# Patient Record
Sex: Female | Born: 1965 | Hispanic: Yes | Marital: Married | Smoking: Never smoker
Health system: Southern US, Community
[De-identification: ages and names within clinical notes are randomized; demographics above are authoritative.]

## PROBLEM LIST (undated history)

## (undated) DIAGNOSIS — M1990 Unspecified osteoarthritis, unspecified site: Secondary | ICD-10-CM

## (undated) DIAGNOSIS — G43909 Migraine, unspecified, not intractable, without status migrainosus: Secondary | ICD-10-CM

## (undated) DIAGNOSIS — E063 Autoimmune thyroiditis: Secondary | ICD-10-CM

## (undated) DIAGNOSIS — D649 Anemia, unspecified: Secondary | ICD-10-CM

## (undated) HISTORY — PX: LIPOMA RESECTION: SHX23

## (undated) HISTORY — DX: Unspecified osteoarthritis, unspecified site: M19.90

## (undated) HISTORY — DX: Migraine, unspecified, not intractable, without status migrainosus: G43.909

## (undated) HISTORY — DX: Anemia, unspecified: D64.9

## (undated) HISTORY — DX: Autoimmune thyroiditis: E06.3

---

## 2002-07-12 HISTORY — PX: VARICOSE VEIN SURGERY: SHX832

## 2003-09-05 ENCOUNTER — Other Ambulatory Visit: Admission: RE | Admit: 2003-09-05 | Discharge: 2003-09-05 | Payer: Self-pay | Admitting: Family Medicine

## 2008-07-12 LAB — CONVERTED CEMR LAB

## 2009-09-08 ENCOUNTER — Ambulatory Visit: Payer: Self-pay | Admitting: Internal Medicine

## 2009-09-08 DIAGNOSIS — D638 Anemia in other chronic diseases classified elsewhere: Secondary | ICD-10-CM | POA: Insufficient documentation

## 2009-09-08 DIAGNOSIS — L819 Disorder of pigmentation, unspecified: Secondary | ICD-10-CM | POA: Insufficient documentation

## 2009-09-08 DIAGNOSIS — D649 Anemia, unspecified: Secondary | ICD-10-CM | POA: Insufficient documentation

## 2009-09-08 DIAGNOSIS — J309 Allergic rhinitis, unspecified: Secondary | ICD-10-CM | POA: Insufficient documentation

## 2009-09-08 DIAGNOSIS — R51 Headache: Secondary | ICD-10-CM | POA: Insufficient documentation

## 2009-09-08 DIAGNOSIS — M1990 Unspecified osteoarthritis, unspecified site: Secondary | ICD-10-CM | POA: Insufficient documentation

## 2009-09-08 DIAGNOSIS — G43009 Migraine without aura, not intractable, without status migrainosus: Secondary | ICD-10-CM | POA: Insufficient documentation

## 2009-09-08 DIAGNOSIS — R519 Headache, unspecified: Secondary | ICD-10-CM | POA: Insufficient documentation

## 2009-09-08 LAB — CONVERTED CEMR LAB
Basophils Absolute: 0.1 10*3/uL (ref 0.0–0.1)
Cholesterol: 154 mg/dL (ref 0–200)
Eosinophils Absolute: 0.2 10*3/uL (ref 0.0–0.7)
HDL: 44.1 mg/dL (ref >39.00)
Iron: 39 ug/dL — ABNORMAL LOW (ref 42–145)
Lymphocytes Relative: 29.3 % (ref 12.0–46.0)
MCHC: 33.1 g/dL (ref 30.0–36.0)
Neutrophils Relative %: 61.5 % (ref 43.0–77.0)
Platelets: 247 10*3/uL (ref 150.0–400.0)
RBC: 3.72 M/uL — ABNORMAL LOW (ref 3.87–5.11)
RDW: 12.9 % (ref 11.5–14.6)
Transferrin: 234 mg/dL (ref 212.0–360.0)
Triglycerides: 48 mg/dL (ref 0.0–149.0)
Vitamin B-12: 453 pg/mL (ref 211–911)

## 2009-09-10 ENCOUNTER — Encounter: Payer: Self-pay | Admitting: Internal Medicine

## 2009-09-16 ENCOUNTER — Telehealth: Payer: Self-pay | Admitting: Internal Medicine

## 2009-09-29 ENCOUNTER — Telehealth: Payer: Self-pay | Admitting: Internal Medicine

## 2009-09-29 DIAGNOSIS — R21 Rash and other nonspecific skin eruption: Secondary | ICD-10-CM | POA: Insufficient documentation

## 2010-08-11 NOTE — Assessment & Plan Note (Signed)
Summary: NEW/ [employer] /[place]  INTERPRETER IS SCHEDULED/NWS   Vital Signs:  Patient profile:   46 year old female Menstrual status:  regular LMP:     09/05/2009 Height:      66 inches Weight:      156.25 pounds BMI:     25.31 O2 Sat:      98 % on Room air Temp:     98.4 degrees F oral Pulse rate:   74 / minute Pulse rhythm:   regular Resp:     16 per minute BP sitting:   100 / 64  (left arm)  Vitals Entered By: Lucious Groves (September 08, 2009 1:46 PM)  Nutrition Counseling: Patient's BMI is greater than 25 and therefore counseled on weight management options.  O2 Flow:  Room air CC: NP--Interpretor present. CPX./kb, Headaches, Headache, Preventive Care Is Patient Diabetic? No Pain Assessment Patient in pain? no      LMP (date): 09/05/2009     Menstrual Status regular Enter LMP: 09/05/2009 Last PAP Result Historical   Primary Care Provider:  Etta Grandchild MD  CC:  NP--Interpretor present. CPX./kb, Headaches, Headache, and Preventive Care.  History of Present Illness:  Headaches      This is a 45 year old woman who presents with Headaches.  The symptoms began >1 year ago.  On a scale of 1 to 10, the intensity is described as a 1.  The patient denies nausea, vomiting, sweats, tearing of eyes, nasal congestion, sinus pain, sinus pressure, photophobia, and phonophobia.  The headache is described as intermittent.  The location of the pain is bitemporal.  The patient denies the following high-risk features: fever, neck pain/stiffness, vision loss or change, focal weakness, altered mental status, rash, trauma, pain worse with exertion, new type of headache, age >50 years, immunosuppression, concomitant infection, and anticoagulation use.  The headaches are precipitated by stress and strenous activity.  Prior treatment has included a NSAID.    Also, she complains of pigmentation over her face for many, many years. She says thata a doctor in [place] treated it before with a ?  cream but it didn't help. She's very upset about the cosmetic appearance of her face.  She complains of dry skni on her heels and says that it is unsightly.  She has had anemia since 34 yoa due to "not eating the right foods".  She is concerned about right great toenail, it feel off a while back so she an artifical nail there now.  Headache HPI:      The patient comes in for an acute, first time visit for headaches.  The current headache started approximately 08/18/1971.  She notes 5+ previous headaches similar to the current one.  Duration of headaches: >1 year.  She has approximately 5+ headaches per month.  Headaches have been occurring since age 6.        On a scale of 1-10, she describes the headache as a 1.  The headaches are not associated with an aura.  The location of the headaches are bilateral.  Headache quality is pressure or tightness.  Aggravating factors include head movement.  Precipitating factors consist of stress and sustained exertion.        The patient denies first or worst H/A of life, change in frequency from prior H/A's, change in severity from prior H/A's, change in features from prior H/A's, new onset H/A's in middle-age or later, new or progressive H/A lasting days, H/A's with Valsalva (cough/sneeze), mylagia, fever, malaise,  weight loss, scalp tenderness, jaw claudication, focal neurologic symptoms, confusion, seizures, and impaired level of consciousness.         Preventive Screening-Counseling & Management  Alcohol-Tobacco     Smoking Status: never      Drug Use:  no.    Current Medications (verified): 1)  Omega-3 350 Mg Caps (Omega-3 Fatty Acids) .... Daily 2)  Advil 200 Mg Tabs (Ibuprofen) .... Prn 3)  Iron Sulfate .... Daily  Allergies (verified): No Known Drug Allergies  Past History:  Past Medical History: Allergic rhinitis Anemia-NOS Headache Osteoarthritis  Past Surgical History: Breast Biopsy 2004 Varicose Veins 2003 Accumulated fat  removed 2004  Family History: Family History of Arthritis (Mother)  Social History: Never Smoked Alcohol use-no Drug use-no Smoking Status:  never Drug Use:  no  Review of Systems       The patient complains of weight gain, headaches, and suspicious skin lesions.  The patient denies anorexia, fever, weight loss, chest pain, syncope, dyspnea on exertion, peripheral edema, prolonged cough, hemoptysis, abdominal pain, depression, enlarged lymph nodes, and angioedema.   Neuro:  Denies brief paralysis, difficulty with concentration, disturbances in coordination, headaches, memory loss, numbness, poor balance, seizures, sensation of room spinning, tingling, tremors, and weakness. Heme:  Denies abnormal bruising, bleeding, enlarge lymph nodes, fevers, pallor, and skin discoloration.  Physical Exam  General:  alert, well-developed, well-nourished, well-hydrated, appropriate dress, normal appearance, healthy-appearing, cooperative to examination, and good hygiene.   Head:  she has benign-appearing hyperigmentation and brown macules symmetrically across her face. there are no nevi and no acne. Eyes:  vision grossly intact, pupils equal, pupils round, and pupils reactive to light.   Ears:  R ear normal and L ear normal.   Mouth:  Oral mucosa and oropharynx without lesions or exudates.  Teeth in good repair. Neck:  supple, full ROM, no masses, no thyromegaly, no thyroid nodules or tenderness, no JVD, no carotid bruits, and no cervical lymphadenopathy.   Lungs:  normal respiratory effort, no intercostal retractions, no accessory muscle use, normal breath sounds, no dullness, no crackles, and no wheezes.   Heart:  normal rate, regular rhythm, no murmur, no gallop, no rub, and no JVD.   Abdomen:  soft, non-tender, normal bowel sounds, no distention, no masses, no guarding, no rigidity, no hepatomegaly, and no splenomegaly.   Msk:  normal ROM, no joint tenderness, no joint swelling, no joint warmth, no  redness over joints, no joint deformities, no joint instability, and no crepitation.   Pulses:  R and L carotid,radial,femoral,dorsalis pedis and posterior tibial pulses are full and equal bilaterally Extremities:  No clubbing, cyanosis, edema, or deformity noted with normal full range of motion of all joints.   Neurologic:  No cranial nerve deficits noted. Station and gait are normal. Plantar reflexes are down-going bilaterally. DTRs are symmetrical throughout. Sensory, motor and coordinative functions appear intact. Skin:  turgor normal, color normal, no rashes, no suspicious lesions, no ecchymoses, no petechiae, no purpura, no ulcerations, and no edema.   Cervical Nodes:  no anterior cervical adenopathy and no posterior cervical adenopathy.   Axillary Nodes:  no R axillary adenopathy and no L axillary adenopathy.   Inguinal Nodes:  no R inguinal adenopathy and no L inguinal adenopathy.   Psych:  Cognition and judgment appear intact. Alert and cooperative with normal attention span and concentration. No apparent delusions, illusions, hallucinations   Impression & Recommendations:  Problem # 1:  COMMON MIGRAINE (ICD-346.10) Assessment New she says the HA's resolve  after a few doses of advil so will continue that for now, I see no need for imaging at this time as she has had the same HA for a long time and she has no alarm features and no neuro. deficits Her updated medication list for this problem includes:    Advil 200 Mg Tabs (Ibuprofen) .Marland Kitchen... Prn  Problem # 2:  MELASMA (ICD-709.09) Assessment: New I gave her a sheet on this and advised that this was a very normal condition in women and that it may not respond to treatment but she insisted that it be treated so I referred her to [facility]. Orders: Venipuncture (16109) TLB-B12 + Folate Pnl ([id number]) TLB-IBC Pnl (Iron/FE;Transferrin) ([id number]) TLB-CBC Platelet - w/Differential (85025-[id number]) Dermatology Referral (Derma)  Problem  # 3:  ANEMIA OF OTHER CHRONIC DISEASE (ICD-285.29) Assessment: Unchanged  Orders: Venipuncture (95621) TLB-CBC Platelet - w/Differential (85025-CBCD) TLB-B12 + Folate Pnl ([id number]/[id number]) TLB-IBC Pnl (Iron/FE;Transferrin) (83550-IBC)  Problem # 4:  ROUTINE GENERAL MEDICAL EXAM@HEALTH  CARE FACL (ICD-V70.0) Assessment: New  Orders: TLB-Lipid Panel ([id number]-LIPID)  Complete Medication List: 1)  Omega-3 350 Mg Caps (Omega-3 fatty acids) .... Daily 2)  Advil 200 Mg Tabs (Ibuprofen) .... Prn 3)  Iron Sulfate  .... Daily  PAP Screening:    Hx Cervical Dysplasia in last 5 yrs? No    3 normal PAP smears in last 5 yrs? Yes    Last PAP smear:  07/12/2008    Reviewed PAP smear recommendations:  patient defers to GYN provider  Mammogram Screening:    Last Mammogram:  07/12/2005  Osteoporosis Risk Assessment:  Risk Factors for Fracture or Low Bone Density:   Smoking status:       never  Immunization & Chemoprophylaxis:    Tetanus vaccine: Historical  (07/12/1996)  Patient Instructions: 1)  Please schedule a follow-up appointment in 1 month.  Prevention & Chronic Care Immunizations   Influenza vaccine: Not documented    Tetanus booster: 07/12/1996: Historical    Pneumococcal vaccine: Not documented  Other Screening   Pap smear: Historical  (07/12/2008)   Pap smear action/deferral: patient defers to GYN provider  (09/08/2009)    Mammogram: Historical  (07/12/2005)   Smoking status: never  (09/08/2009)  Lipids   Total Cholesterol: Not documented   LDL: Not documented   LDL Direct: Not documented   HDL: Not documented   Triglycerides: Not documented   Preventive Care Screening  Pap Smear:    Date:  07/12/2008    Results:  Historical   Mammogram:    Date:  07/12/2005    Results:  Historical   Last Tetanus Booster:    Date:  07/12/1996    Results:  Historical

## 2010-08-11 NOTE — Letter (Signed)
Summary: Lipid Letter  [facility]  [address] [place], [place] [address]   Phone: [phone]  Fax: [phone]    09/10/2009  Caitlin Dunlap [address] [place], [place]  [address]  Dear Caitlin Dunlap:  We have carefully reviewed your last lipid profile from 09/08/2009 and the results are noted below with a summary of recommendations for lipid management.    Cholesterol:       154     Goal: <200   HDL "good" Cholesterol:   62.83     Goal: >40   LDL "bad" Cholesterol:   100     Goal: <130   Triglycerides:       48.0     Goal: <150    other labs show that you are mildly anemic and your iron level is slightly low    TLC Diet (Therapeutic Lifestyle Change): Saturated Fats & Transfatty acids should be kept < 7% of total calories ***Reduce Saturated Fats Polyunstaurated Fat can be up to 10% of total calories Monounsaturated Fat Fat can be up to 20% of total calories Total Fat should be no greater than 25-35% of total calories Carbohydrates should be 50-60% of total calories Protein should be approximately 15% of total calories Fiber should be at least 20-30 grams a day ***Increased fiber may help lower LDL Total Cholesterol should be < 200mg /day Consider adding plant stanol/sterols to diet (example: Benacol spread) ***A higher intake of unsaturated fat may reduce Triglycerides and Increase HDL    Adjunctive Measures (may lower LIPIDS and reduce risk of Heart Attack) include: Aerobic Exercise (20-30 minutes 3-4 times a week) Limit Alcohol Consumption Weight Reduction Aspirin 75-81 mg a day by mouth (if not allergic or contraindicated) Dietary Fiber 20-30 grams a day by mouth     Current Medications: 1)    Omega-3 350 Mg Caps (Omega-3 fatty acids) .... Daily 2)    Advil 200 Mg Tabs (Ibuprofen) .... Prn 3)    Iron Sulfate  .... Daily  If you have any questions, please call. We appreciate being able to work with you.   Sincerely,    [facility] Etta Grandchild MD

## 2010-08-11 NOTE — Progress Notes (Signed)
Summary: Referral  Phone Note Call from Patient Call back at Home Phone [phone]   Caller: Patient Summary of Call: pt requesting referral for allergy test, dermatologist recommended, stated it must come from PCP. please advise Initial call taken by: Sydell Axon,  September 16, 2009 4:23 PM  Follow-up for Phone Call        i need a diagnosis Follow-up by: Etta Grandchild MD,  September 16, 2009 6:39 PM  Additional Follow-up for Phone Call Additional follow up Details #1::        attempted to call pt, no answer, no machine. will try again later Additional Follow-up by: Sydell Axon,  September 17, 2009 9:02 AM    Additional Follow-up for Phone Call Additional follow up Details #2::    Spoke with pt, she will have her interpreter call back Follow-up by: Sherese Christopher September 18, 2009 11:04 AM  Additional Follow-up for Phone Call Additional follow up Details #3:: Details for Additional Follow-up Action Taken: I spoke with pt and she will have her daughter call when conveinient. Lucious Groves  September 18, 2009 1:26 PM

## 2010-08-11 NOTE — Progress Notes (Signed)
Summary: REFERRAL  Phone Note Call from Patient   Caller: Jedi [phone] - ok vm  Summary of Call: Please see previous phone note. Pt has been seeing dermatologist who can not determine cause of multiple rashes. Please refer to allergist.  Initial call taken by: Lamar Sprinkles, CMA,  September 29, 2009 3:01 PM  New Problems: FACIAL RASH (ICD-782.1)   New Problems: FACIAL RASH (ICD-782.1)

## 2010-09-09 ENCOUNTER — Emergency Department (INDEPENDENT_AMBULATORY_CARE_PROVIDER_SITE_OTHER): Payer: No Typology Code available for payment source

## 2010-09-09 ENCOUNTER — Emergency Department (HOSPITAL_BASED_OUTPATIENT_CLINIC_OR_DEPARTMENT_OTHER)
Admission: EM | Admit: 2010-09-09 | Discharge: 2010-09-09 | Disposition: A | Discharge status: Home / Self Care | Payer: No Typology Code available for payment source | Attending: Emergency Medicine | Admitting: Emergency Medicine

## 2010-09-09 DIAGNOSIS — S335XXA Sprain of ligaments of lumbar spine, initial encounter: Secondary | ICD-10-CM | POA: Insufficient documentation

## 2010-09-09 DIAGNOSIS — Y92410 Unspecified street and highway as the place of occurrence of the external cause: Secondary | ICD-10-CM | POA: Insufficient documentation

## 2010-09-09 DIAGNOSIS — M545 Low back pain, unspecified: Secondary | ICD-10-CM

## 2011-09-28 ENCOUNTER — Ambulatory Visit: Payer: No Typology Code available for payment source | Admitting: Gynecology

## 2011-09-30 ENCOUNTER — Ambulatory Visit: Payer: No Typology Code available for payment source | Admitting: Gynecology

## 2011-10-04 ENCOUNTER — Encounter: Payer: Self-pay | Admitting: Gynecology

## 2011-10-04 ENCOUNTER — Ambulatory Visit (INDEPENDENT_AMBULATORY_CARE_PROVIDER_SITE_OTHER): Payer: BC Managed Care – PPO | Admitting: Gynecology

## 2011-10-04 ENCOUNTER — Other Ambulatory Visit (HOSPITAL_COMMUNITY)
Admission: RE | Admit: 2011-10-04 | Discharge: 2011-10-04 | Disposition: A | Discharge status: Home / Self Care | Payer: BC Managed Care – PPO | Source: Ambulatory Visit | Attending: Gynecology | Admitting: Gynecology

## 2011-10-04 VITALS — BP 118/70 | Ht 63.5 in | Wt 156.0 lb

## 2011-10-04 DIAGNOSIS — R7989 Other specified abnormal findings of blood chemistry: Secondary | ICD-10-CM

## 2011-10-04 DIAGNOSIS — Z01419 Encounter for gynecological examination (general) (routine) without abnormal findings: Secondary | ICD-10-CM | POA: Insufficient documentation

## 2011-10-04 DIAGNOSIS — R635 Abnormal weight gain: Secondary | ICD-10-CM

## 2011-10-04 DIAGNOSIS — N390 Urinary tract infection, site not specified: Secondary | ICD-10-CM

## 2011-10-04 DIAGNOSIS — R946 Abnormal results of thyroid function studies: Secondary | ICD-10-CM

## 2011-10-04 LAB — LIPID PANEL
Total CHOL/HDL Ratio: 4.1 Ratio
VLDL: 16 mg/dL (ref 0–40)

## 2011-10-04 LAB — CBC WITH DIFFERENTIAL/PLATELET
Eosinophils Relative: 2 % (ref 0–5)
HCT: 34.2 % — ABNORMAL LOW (ref 36.0–46.0)
Lymphocytes Relative: 33 % (ref 12–46)
Lymphs Abs: 1.9 10*3/uL (ref 0.7–4.0)
MCV: 87.7 fL (ref 78.0–100.0)
Monocytes Absolute: 0.3 10*3/uL (ref 0.1–1.0)
Platelets: 302 10*3/uL (ref 150–400)
RBC: 3.9 MIL/uL (ref 3.87–5.11)
WBC: 5.6 10*3/uL (ref 4.0–10.5)

## 2011-10-04 LAB — GLUCOSE, RANDOM: Glucose, Bld: 86 mg/dL (ref 70–99)

## 2011-10-04 NOTE — Patient Instructions (Signed)
Informacin sobre el dispositivo intrauterino  (Intrauterine Device Information) El dispositivo intrauterino (DIU) se inserta en el tero e impide el embarazo. Hay dos tipos de DIU:   DIU de cobre. Este tipo de DIU est recubierto con un alambre de cobre y se inserta dentro del tero. El cobre hace que el tero y las trompas de [place] produzcan un liquido que [employer] espermatozoides. El DIU de cobre puede Geneticist, molecular durante 10 aos.   DIU hormonal. Este tipo de DIU contiene la hormona progestina (progesterona sinttica). La hormona espesa el moco cervical y evita que los espermatozoides ingresen al tero y tambin afina la membrana que cubre el tero para evitar la implantacin del vulo fertilizado. La hormona debilita o destruye los espermatozoides que ingresan al tero. El DIU hormonal puede Geneticist, molecular durante 5 aos.  El mdico se asegurar de que usted es una buena candidata para usar el DIU cono anticonceptivo. Hable con su mdico acerca de los posibles efectos secundarios.  VENTAJAS  Es muy eficaz, reversible, de accin prolongada y de bajo mantenimiento.   No hay efectos secundarios relacionados con el estrgeno.   El DIU puede ser utilizado durante la Market researcher.   No est asociado con el aumento de [place].   Funciona inmediatamente despus de la insercin.   El DIU de cobre no interfiere con las hormonas femeninas.   El DIU con progesterona puede hacer que los perodos menstruales no sean tan abundantes.   El DIU de progesterona puede usarse durante 5 aos.   El DIU de cobre puede usarse durante 10 aos.  DESVENTAJAS  El DIU de progesterona puede estar asociado con patrones de sangrado irregular.   El DIU de cobre puede hacer que el flujo menstrual ms abundante y doloroso.   Puede experimentar clicos y sangrado vaginal despus de la insercin.  Document Released: 12/16/2009 Document Revised: 06/17/2011 [facility] Patient Information 2012  [place], [place].  Recuredese de hacer cita para la mamografia. Tambien llamar oficina de Dr. Lily Peer [phone]) cuando le empieze el periodo para cambiar el depositivo.

## 2011-10-04 NOTE — Progress Notes (Signed)
Caitlin Dunlap [birth year] [id number]   History:    46 y.o.  for annual exam gravida 3 para 3 new patient to the practice. Patient complaining of weight gain as well as requesting to change her IUD. It appears that she has a ParaGard IUD not sure what is due to come out. She could not tolerate a Mirena IUD in the past. She complained of weight gain with a Mirena IUD. Patient has not had a mammogram in over 10 years. And her last Pap smear was in 2010 and prior Pap smears been normal. She frequently does her self was examination.  Past medical history,surgical history, family history and social history were all reviewed and documented in the EPIC chart.  Gynecologic History Patient's last menstrual period was 09/27/2011. Contraception: IUD Last Pap: 2010. Results were: normal Last mammogram: 10 years prior. Results were: normal  Obstetric History OB History    Grav Para Term Preterm Abortions TAB SAB Ect Mult Living   3 3 3       3      # Outc Date GA Lbr Len/2nd Wgt Sex Del Anes PTL Lv   1 TRM     M SVD  No Yes   2 TRM     F SVD  No Yes   3 TRM     M SVD  No Yes       ROS:  Was performed and pertinent positives and negatives are included in the history.  Exam: chaperone present  BP 118/70  Ht 5' 3.5" (1.613 m)  Wt 156 lb (70.761 kg)  BMI 27.20 kg/m2  LMP 09/27/2011  Body mass index is 27.20 kg/(m^2).  General appearance : Well developed well nourished female. No acute distress HEENT: Neck supple, trachea midline, no carotid bruits, no thyroidmegaly Lungs: Clear to auscultation, no rhonchi or wheezes, or rib retractions  Heart: Regular rate and rhythm, no murmurs or gallops Breast:Examined in sitting and supine position were symmetrical in appearance, no palpable masses or tenderness,  no skin retraction, no nipple inversion, no nipple discharge, no skin discoloration, no axillary or supraclavicular lymphadenopathy Abdomen: no palpable masses or tenderness, no rebound or  guarding Extremities: no edema or skin discoloration or tenderness  Pelvic:  Bartholin, Urethra, Skene Glands: Within normal limits             Vagina: No gross lesions or discharge  Cervix: No gross lesions or discharge, IUD string seen  Uterus  anteverted, normal size, shape and consistency, non-tender and mobile  Adnexa  Without masses or tenderness  Anus and perineum  normal   Rectovaginal  normal sphincter tone without palpated masses or tenderness             Hemoccult not done     Assessment/Plan:  46 y.o. female for annual exam who is currently fasting and since she is complaining of weight gain we will go ahead and obtain a fasting blood sugar along with her TSH fasting lipid profile, CBC, urinalysis and Pap smear. She will make arrangements to return back to the office at time of her menses to remove the IUD and replace it with a ParaGard T380A IUD as per her wishes. She was given a requisition to schedule her mammogram. Literature information on the IUD was provided in [place]. She was encouraged to continue to do her monthly self breast examination. We'll also discuss with her the importance of calcium and vitamin D for osteoporosis prevention along with regular exercise.  Ok Edwards MD, 12:26 PM 10/04/2011

## 2011-10-05 LAB — URINALYSIS W MICROSCOPIC + REFLEX CULTURE
Bacteria, UA: NONE SEEN
Casts: NONE SEEN
Crystals: NONE SEEN
Ketones, ur: NEGATIVE mg/dL
Nitrite: NEGATIVE
Specific Gravity, Urine: 1.023 (ref 1.005–1.030)
pH: 6 (ref 5.0–8.0)

## 2011-10-05 MED ORDER — NITROFURANTOIN MONOHYD MACRO 100 MG PO CAPS: 100.0000 mg | ORAL_CAPSULE | Freq: Two times a day (BID) | ORAL | Status: AC

## 2011-10-05 NOTE — Progress Notes (Signed)
Addended by: Bertram Savin A on: 10/05/2011 10:55 AM   Modules accepted: Orders

## 2011-10-12 ENCOUNTER — Other Ambulatory Visit: Payer: Self-pay | Admitting: Anesthesiology

## 2011-10-12 ENCOUNTER — Other Ambulatory Visit: Payer: Self-pay | Admitting: Gynecology

## 2011-10-12 ENCOUNTER — Other Ambulatory Visit: Payer: BC Managed Care – PPO

## 2011-10-12 DIAGNOSIS — R7989 Other specified abnormal findings of blood chemistry: Secondary | ICD-10-CM

## 2011-10-12 DIAGNOSIS — E0590 Thyrotoxicosis, unspecified without thyrotoxic crisis or storm: Secondary | ICD-10-CM

## 2011-10-13 LAB — THYROID PANEL WITH TSH
Free Thyroxine Index: 3.8 (ref 1.0–3.9)
T3 Uptake: 33.4 % (ref 22.5–37.0)
TSH: 0.03 u[IU]/mL — ABNORMAL LOW (ref 0.350–4.500)

## 2011-10-20 ENCOUNTER — Other Ambulatory Visit: Payer: Self-pay | Admitting: Gynecology

## 2011-10-20 DIAGNOSIS — Z1231 Encounter for screening mammogram for malignant neoplasm of breast: Secondary | ICD-10-CM

## 2011-10-27 ENCOUNTER — Ambulatory Visit: Payer: BC Managed Care – PPO

## 2011-11-03 ENCOUNTER — Other Ambulatory Visit: Payer: BC Managed Care – PPO

## 2011-11-03 ENCOUNTER — Ambulatory Visit: Payer: BC Managed Care – PPO

## 2011-11-03 ENCOUNTER — Other Ambulatory Visit: Payer: Self-pay | Admitting: *Deleted

## 2011-11-03 DIAGNOSIS — N390 Urinary tract infection, site not specified: Secondary | ICD-10-CM

## 2011-11-03 DIAGNOSIS — Z3049 Encounter for surveillance of other contraceptives: Secondary | ICD-10-CM

## 2011-11-03 NOTE — Progress Notes (Signed)
paraguard covered 100%.

## 2011-11-04 ENCOUNTER — Ambulatory Visit
Admission: RE | Admit: 2011-11-04 | Discharge: 2011-11-04 | Disposition: A | Discharge status: Home / Self Care | Payer: BC Managed Care – PPO | Source: Ambulatory Visit | Attending: Gynecology | Admitting: Gynecology

## 2011-11-04 ENCOUNTER — Telehealth: Payer: Self-pay | Admitting: Anesthesiology

## 2011-11-04 DIAGNOSIS — Z1231 Encounter for screening mammogram for malignant neoplasm of breast: Secondary | ICD-10-CM

## 2011-11-04 DIAGNOSIS — R7989 Other specified abnormal findings of blood chemistry: Secondary | ICD-10-CM

## 2011-11-04 LAB — URINALYSIS W MICROSCOPIC + REFLEX CULTURE
Bacteria, UA: NONE SEEN
Bilirubin Urine: NEGATIVE
Glucose, UA: NEGATIVE mg/dL
Ketones, ur: NEGATIVE mg/dL
Protein, ur: NEGATIVE mg/dL

## 2011-11-04 NOTE — Telephone Encounter (Signed)
CALLED PATIENT TO INFORMED HER THAT HER TSH LEVEL WAS  BELOW NORMAL AND THAT SHE NEEDS TO HAVE A FULL TSH PANEL REPEATED IT.Marland Kitchen ORDERS HAVE BEEN PUT IN PC.Marland KitchenMarland Kitchen

## 2011-11-05 LAB — URINE CULTURE

## 2011-11-09 ENCOUNTER — Other Ambulatory Visit: Payer: Self-pay | Admitting: *Deleted

## 2011-11-09 DIAGNOSIS — N63 Unspecified lump in unspecified breast: Secondary | ICD-10-CM

## 2011-11-10 HISTORY — PX: INTRAUTERINE DEVICE INSERTION: SHX323

## 2011-11-15 ENCOUNTER — Other Ambulatory Visit: Payer: BC Managed Care – PPO

## 2011-11-15 DIAGNOSIS — R7989 Other specified abnormal findings of blood chemistry: Secondary | ICD-10-CM

## 2011-11-15 LAB — T3 UPTAKE: T3 Uptake: 30.4 % (ref 22.5–37.0)

## 2011-11-15 LAB — T4: T4, Total: 8.2 ug/dL (ref 5.0–12.5)

## 2011-11-15 LAB — TSH: TSH: 5.441 u[IU]/mL — ABNORMAL HIGH (ref 0.350–4.500)

## 2011-11-16 ENCOUNTER — Ambulatory Visit (INDEPENDENT_AMBULATORY_CARE_PROVIDER_SITE_OTHER): Payer: BC Managed Care – PPO | Admitting: Gynecology

## 2011-11-16 ENCOUNTER — Encounter: Payer: Self-pay | Admitting: Gynecology

## 2011-11-16 VITALS — BP 120/70

## 2011-11-16 DIAGNOSIS — E079 Disorder of thyroid, unspecified: Secondary | ICD-10-CM | POA: Insufficient documentation

## 2011-11-16 DIAGNOSIS — D649 Anemia, unspecified: Secondary | ICD-10-CM

## 2011-11-16 LAB — IRON AND TIBC: %SAT: 29 % (ref 20–55)

## 2011-11-16 LAB — FOLATE: Folate: 9.4 ng/mL

## 2011-11-16 NOTE — Patient Instructions (Signed)
Enfermedades tiroideas  (Thyroid Diseases)  La tiroides es una glándula con forma de mariposa que se encuentra en el cuello. Se ubica justo por arriba de las clavículas. Es una de las glándulas endocrinas, que producen hormonas. La tiroides interviene en el control del metabolismo. El metabolismo es el modo en que el organismo utiliza los alimentos.   Millones de personas sufren enfermedades tiroideas. Las mujeres experimentan problemas en las tiroides con más frecuencia que los hombres. De hecho, los problemas de una tiroides muy activa (hipertiroidismo) ocurre en el 1% de toda las mujeres. Si usted sufre una enfermedad en la tiroides, su organismo puede estar utilizando la energía más lentamente, o más rápidamente de lo que debería.   Los problemas en las tiroides también incluyen una enfermedad inmunológica en la que el organismo reacciona en contra de la glándula tiroides (enfermedad denominada tiroiditis). Un problema diferente implica la aparición de bultos (denominados nódulos) que se desarrollan en la glándula. Los nódulos, generalmente pero no siempre, son benignos.  PROBLEMAS DE TIROIDES MÁS FRECUENTES Y SUS CAUSAS  Hay muchas causas que originan problemas en las tiroides. El tratamiento depende del diagnóstico exacto e incluye la recomposición del metabolismo a valores normales.  Hipertiroidismo  Se denomina hipertiroidismo al exceso de hormona tiroidea debido a una glándula tiroides muy activa. En el hipertiroidismo, el metabolismo se acelera. Una de las formas más frecuentes de hipertiroidismo se denomina enfermedad de [place]. Esta enfermedad tiende a aparecer en algunas familias. Aunque se considera que la causa se encuentra en un problema del sistema inmunológico, la naturaleza exacta del problema genético es desconocida.  Hipotiroidismo  Se denomina hipotiroidismo a la falta de hormona tiroidea debido a una glándula tiroidea poco activa. En el hipotiroidismo, el metabolismo se ralentiza. Son varias  las causas para esta enfermedad. La mayoría de las causas afectan la glándula tiroides directamente y dañan su capacidad de fabricar la cantidad suficiente de hormonas.   En algunos casos raros de tratarse de un tumor en la glándula hipófisis (ubicada cerca de la base del cerebro). El tumor puede bloquear la hipófisis y hacer que deje de producir la hormona que estimula la tiroides (TSH), El organismo produce TSH para estimular buen funcionamiento de la tiroides. Si la hipófisis no produce la cantidad suficiente de TSH, la tiroides no puede producir la cantidad suficiente de hormonas necesarias para mantener un buen estado de salud.  Ya sea que el problema se encuentre en una enfermedad de la tiroides o de la hipófisis, el resultado es que la tiroides no produce la cantidad suficiente de hormonas. El hipotiroidismo ocasiona el mal funcionamiento de muchos procesos físicos y mentales. El organismo consume menos oxígeno y produce menos calor corporal.  Nódulos en las tiroides  Un módulo en la tiroides es un pequeño bulto que se encuentra sobre la glándula. Estos nódulos son frecuentes. Puede ser un crecimiento del tejido tiroideo o un quiste lleno de líquido. Ambos forman un bulto sobre la glándula. Casi la mitad de todas las personas tendrá pequeños nódulos en las tiroides en algún momento de su vida. Típicamente no pueden notarse hasta que son grandes y afectan el tamaño normal de la tiroides. Los nódulos más grandes (alrededor de 1 cm) ocurren en alrededor del 5% de las personas.  Aunque la mayoría de estos nódulos no son malignos, deberán realizarse los estudios para descartar el cáncer. También algunos nódulos pueden producir mucha cantidad de hormona tiroidea o agrandarse demasiado. Los nódulos grandes o una glándula agrandada pueden interferir en la   Otros problemas son el cncer y la tiroiditis La tiroiditis es un mal  funcionamiento del sistema inmunolgico del organismo. Normalmente, el sistema inmunolgico funciona defendiendo al organismo de las infecciones y [employer]. Cuando el sistema inmunolgico no funciona adecuadamente, puede atacar clulas, tejidos y rganos normales. Ejemplos de enfermedades autoinmunes son la tiroiditis de Hashimoto (que causa una funcin tiroidea lenta) y la enfermedad de [place] (que causa exceso del funcionamiento tiroideo). SNTOMAS Los sntomas varan segn el tipo exacto del problema de la tiroides. Hipertiroidismo- cuando la glndula tiroides es [place] y produce ms hormona tiroidea que la que el organismo necesita. La causa ms frecuente es la enfermedad de [place]. Demasiada cantidad de hormona tiroidea puede causar alguno o todos los siguientes sntomas.  Ansiedad.   Irritabilidad.   Dificultad para dormir.   Fatiga.   Latidos cardacos irregulares o rpidos.   Temblor fino en manos o dedos.   Aumento de la transpiracin.   Sensibilidad al calor.   Prdida de peso a pesar de la ingesta normal de alimentos.   Debilitamiento del cabello.   Agrandamiento de las tiroides (bocio).   Perodos menstruales escasos.   Movimientos intestinales frecuentes.  La enfermedad de [place] puede especificamente causar problemas en la piel y los ojos. Los problemas en la piel implican el enrojecimiento e hinchazn de la pierna y de la parte superior de los pies. Los problemas oculares pueden consistir en:  Manson Allan de lagrimeo y sensacin de tener un cuerpo extrao en uno o ambos ojos.   Enrojecimiento o inflamacin.   Mayor [employer] prpados.   Hinchazn de los prpados y los tejidos que rodean los ojos.   Sensibilidad a la luz.   lceras en la crnea.   Visin doble.   Movimientos limitados de los ojos.   Visin borrosa o reducida.  Hipotiroidismo: la glndula tiroides no es lo Science writer. Es un trastorno ms frecuente que el  hipertiroidismo. Los sntomas pueden variar mucho segn la gravedad de la deficiencia hormonal. Los sntomas pueden desarrollarse durante un largo perodo incluyen algunos de los siguientes:  Occoquan.   Lentitud   Mayor sensibilidad al fro.   Constipacin.   Piel plida y [place].   Rostro hinchado.   Voz ronca.   Nivel elevado de colesterol en la sangre.   Aumento de peso sin motivo.   Dolor , sensibilidad y Forensic psychologist.   Dolor, rigidez o Architect.   Debilitamiento muscular.   Perodos menstruales ms abundantes que lo normal.   Cabello y uas debilitados.   Depresin.  Ndulos en las tiroides- generalmente no causan signos o sntomas, En ocasiones pueden agrandarse de modo que puede sentirlos o verlos como una zona hinchada en la base del cuello. Puede notar el bulto al [employer]. Los hombres pueden notarlo cuando el cuello de la camisa comienza a sentirse ajustado. Algunos ndulos producen demasiada hormona tiroidea. Esto puede [employer] mismos sntomas que el hipertiroidismo (vase ms [place]).  Los ndulos en las tiroides rara vez son malignos. Sin embargo, es probable que sea maligno si:  Crece rpidamente o se lo siente duro.   Le ocasiona ronquera o tiene problemas para tragar o respirar.   Se agrandan los ganglios debajo de la mandbula o en el cuello.  DIAGNSTICO [employer] tantas enfermedades de las tiroides, Set designer algunas pruebas. Deber establecer un diagnstico exacto. Estas pruebas pueden ser:  Anlisis de sangre y anticuerpos.   Escaneos especiales de la  tiroides, usando pequeas cantidades inocuas de yodo radiactivo.   Ecografas de la glndula, (especialmente si hay un ndulo o bulto).   Una biopsia. Esto se lleva a cabo con una aguja especial. Una biopsia de aguja es un procedimiento por el que se obtiene [place] de clulas de la glndula. El tejido ser [employer] en un laboratorio y  examinado al microscopio.  TRATAMIENTO El tratamiento depende del diagnstico exacto. Hipertiroidismo  Los betabloqueantes ayudan a [employer].   Los medicamentos antitiroideos evitan la produccin excesiva de hormona.   El tratamiento con yodo radiactivo puede destruir las clulas tiroideas hiperactivas. Reduce permanentemente la cantidad de hormona que se produce.   Tratamiento quirrgico para extirpar la glndula.   Tratamientos para los problemas oculares como en la enfermedad de [place] tambin incluyen medicamentos y cirugas oculares especiales, si se considera apropiado.  Hipotiroidismo Reposicin de hormona tiroidea con levotiroxina es el tratamiento fundamental. La reposicin de hormona tiroidea es un tratamiento de por vida y requerir el control y ajustes peridicos. Ndulos en las tiroides  Hay que observarlos. Si un ndulo pequeo no causa sntomas o no hay signos de cncer en la biopsia, no se realiza tratamiento. Se recomienda realizar exmenes y anlisis de [place] peridicamente.   Los medicamentos antitiroideos o el que se realiza con yodo radiactivo se recomiendan si los ndulos producen demasiada hormona (vase Tratamiento para el hipotiroidismo, ms [place]).   Ablacin alcohlica Las inyecciones de pequeas cantidades de alcohol etlico (etanol) pueden hacer que un ndulo no canceroso disminuya su tamao.   Ciruga (vase Tratamiento para el hipertiroidismo, ms [place])  INSTRUCCIONES PARA EL CUIDADO DOMICILIARIO  Tome todos los medicamentos de la forma en que se le indic.   Hgase los anlisis recomendados.  SOLICITE ATENCIN MDICA SI:  Siente que tiene sntomas de hipertiroidismo o hipotiroidismo como los que se han descrito.   Desarrolla nuevos bultos o ndulos en el cuello o en la zona de la tiroides que no haba notado antes.   Siente efectos adversos por los medicamentos prescriptos.   Tiene problemas para hablar, respirar o tragar.   SOLICITE ATENCIN MDICA DE INMEDIATO SI:  La temperatura se eleva por encima de 102 F (39.8 C).   La sudoracin es intensa.   Tiene palpitaciones o frecuencia cardaca acelerada.   Le falta el aire.   Presenta nuseas o vmitos.   Tiene temblores intensos.   Se siente agitado.   Se marea o tiene un episodio de [place].  Document Released: 10/14/2008 Document Revised: 06/17/2011 [facility] Patient Information 2012 [place], [place].  Anemia, preguntas frecuentes (Anemia, Frequently Asked Questions) CULES SON LOS SNTOMAS DE LA ANEMIA?  Dolor de [place]   Dificultad para pensar.   Fatiga.   Falta de aire.   Debilidad.   Frecuencia cardaca rpida.  EN QU PUNTO SE PUEDE CONSIDERAR A UNA PERSONA ANMICA?  Esto vara con el sexo y la edad.   Para definir la anemia se utilizan los valores de hemoglobina (Hgb) y Radiation protection practitioner. Estos valores de laboratorio se obtienen de un recuento completo de sangre (CBC) completo. Se realiza en el consultorio del mdico.   El rango normal de valores de hemoglobina para adultos hombres es de 14.0 g/dL a 29.9 g/dL. Para mujeres no embarazadas, los valores son de 12.3 g/dL a 37.1 g/dL.   La Organizacin Mundial de la Salud define el valor de la anemia en menos de 12 g/dL para mujeres no embarazadas y menos de 13 g/dL para hombres.  Para hombres adultos, el promedio normal de hematocrito es de 46% y el rango vara entre 40% y 52%.   Para mujeres adultas, el promedio normal de hematocrito es de 41% y el rango vara entre 35% y 47%.   Los valores que caen por debajo de los lmites normales pueden ser sntoma de anemia y deben tener un mayor control (evaluacin).  GRUPOS DE PERSONAS QUE TIENEN UN MAYOR RIESGO DE DESARROLLAR ANEMIA:   Bebs que se alimentan del pecho de la madre o que ingieren preparado para bebs no fortificado con hierro.   Nio que estn atravesando un perodo de crecimiento rpido. El hierro disponible no puede cumplir  con las necesidades de los glbulos rojos que deben crecer junto con el nio.   Mujeres en edad frtil. Necesitan hierro debido a la prdida de Radiation protection practitioner.   Mujeres embarazadas. El feto en crecimiento crea una alta demanda de hierro.   Las personas con hemorragia gastrointestinal continua estn en riesgo de desarrollar una deficiencia de hierro.   Individuos con leucemia o cncer que deben recibir quimioterapia o radiacin para tratar su enfermedad. Las drogas o radiacin utilizadas para tratar estas enfermedades a menudo disminuyen la capacidad de la mdula sea para producir todo tipo de glbulos: sean glbulos rojos, blancos o plaquetas.   Individuos con enfermedades inflamatorias crnicas como artritis reumatoidea o infecciones crnicas.   Los ancianos.  EXISTE ALGN TIPO DE ANEMIA QUE SEA HEREDITARIO?   Si, algunos tipos de anemia se deben a defectos heredados o genticos.   Anemia drepanoctica. Esto ocurre con ms frecuencia en personas descendientes de africanos, afro-americanos y Child psychotherapist.   Talasemia (o anemia de [place]). Este tipo de anemia se encuentran en personas descendientes de Child psychotherapist y del sur de [place]. Estos tipos de anemia son muy comunes.   Anemia de [place]. Este trastorno es muy poco comn.  ES POSIBLE QUE CIERTOS MEDICAMENTOS PRODUZCAN ANEMIA?  S. Por ejemplo, las drogas para Consulting civil engineer (agentes qumico-teraputicos) a menudo causan anemia. Estas drogas pueden disminuir la capacidad de la mdula sea para producir glbulos rojos. Si no hay suficientes glbulos rojos, el cuerpo no toma la cantidad [place] de oxgeno. QU NIVEL DE HEMATOCRITO ES SUFICIENTE PARA DONAR SANGRE?  La menor cantidad aceptable de hematocrito para donantes es de 38%. Si tiene Engineer, building services de hematocrito bajo, deber concertar una cita con el mdico. SUELEN REALIZARSE TRANSFUSIONES PARA TRATAR LA ANEMIA? SON PELIGROSAS?  Se utilizan como ltimo recurso  para tratar la anemia. El Pensions consultant la causa de la anemia y de ser posible la corregir. La mayora de las transfusiones se realizan debido a una hemorragia excesiva durante una ciruga, por traumatismos, o debido a una supresin de la mdula sea en pacientes con cncer o leucemia durante la quimioterapia. Las transfusiones son mucho ms seguras que antes. Tambin es sabido que las transfusiones afectan al sistema inmune y pueden aumentar ciertos riesgos. Existe una posibilidad de error humano, en la que 1 de cada 16.000 transfusiones puede resultar en que el paciente reciba una transfusin que no concuerda con su tipo de [place].  QU ES LA DEFICIENCIA DE HIERRO? PUEDO CORREGIRLA CON UN CAMBIO EN MI DIETA?  El hierro es una parte esencial de la hemoglobina. Sin hemoglobina suficiente, se desarrolla anemia y el cuerpo no toma la cantidad suficiente de oxgeno. La anemia por deficiencia de hierro se desarrolla una vez que el cuerpo ha tenido un bajo nivel de hierro durante [place]. Esto puede ocasionarse  tanto por prdida de sangre, no tomar o absorber hierro suficiente, o un aumento en la demanda de hierro (como durante el embarazo o el rpido Designer, jewellery).  Los alimentos de origen animal como carne de vaca, pollo o cerdo, son buenas fuentes de hierro. Asegrese de consumir uno de estos alimentos con cada comida. La vitamina C le ayuda a que el cuerpo absorba hierro. Los alimentos ricos en vitamina C incluyen ctricos, pimientos, fresas, espinacas y cantalupos. En algunos casos puede ser necesario un suplemento de hierro para asegurarse de corregir la deficiencia de hierro. En el caso de una mala absorcin, el hierro extra deber administrarse directamente en la vena con una inyeccin (va intravenosa). ME HAN DIAGNOSTICADO ANEMIA POR DEFICIENCIA DE HIERRO Y EL MDICO ME HA RECETADO SUPLEMENTOS DE HIERRO. CUNTO TIEMPO DEBO TOMARLO PARA LLEGAR A LOS NIVELES NORMALES?  Depende del grado de anemia  al comienzo del tratamiento. La mayora de las personas con deficiencia de hierro leve a moderada, Copy los niveles normales despus de 2 o 3 meses. Sin embargo una vez corregida la anemia, el hierro almacenado en el cuerpo sigue siendo bajo. Los mdicos suelen sugerir una terapia adicional de hierro por 6 meses una vez que se ha corregido la anemia. Esto ayudar a prevenir que la anemia por deficiencia de hierro reaparezca rpidamente.  MI NIVEL DE HEMOGLOBINA ES DE 9 G/DL Y DEBO REALIZARME UNA CIRUGA. DEBERA POSPONERLA?  Si tiene un nivel de hemoglobina de 9, deber comentarlo de inmediato con el profesional que lo asiste. Muchos pacientes con niveles de hemoglobina similares han sido intervenidos quirrgicamente sin problemas. Si se espera que haya una mnima prdida de sangre de un procedimiento menor, no ser necesario tratamiento alguno.  Si se espera una prdida de [place] mayor para procedimientos ms extensivos, deber preguntar al mdico acerca de realizar un tratamiento con eritropoyetina y hierro para acelerar la recuperacin de la hemoglobina a niveles normales antes de la ciruga. Un paciente anmico que debe realizarse una ciruga que conlleva una gran prdida de sangre tiene un mayor riesgo de sufrir complicaciones y Pension scheme manager una transfusin, que tambin conlleva algn riesgo.  HE ODO QUE LOS PERODOS MENSTRUALES FUERTES CAUSAN ANEMIA. HAY ALGO QUE PUEDA HACER PARA PREVERNIRLA?  La anemia que resulta de perodos con prdidas menstruales abundantes suele deberse a una deficiencia de hierro. Puede intentar cubrir la demanda creciente de hierro que se ocasiona por las prdidas menstruales fuertes mediante el aumento del consumo de alimentos ricos en hierro. Podrn ser necesarios suplementos de hierro. Consulte con el mdico si hay algo que le preocupa. QU OCASIONA LA ANEMIA DURANTE EL EMBARAZO?  El embarazo realiza grandes demandas al cuerpo. La madre debe cumplir con las  necesidades tanto de su cuerpo como del beb por nacer. El cuerpo necesita hierro y folato suficientes para realizar una adecuada cantidad de glbulos rojos. Para prevenir la anemia durante el embarazo, la Museum/gallery conservator en contacto constante con el mdico.  Asegrese de consumir una dieta rica en hierro y folato como hgado y vegetales de Lawyer. El folato cumple un papel muy importante en el desarrollo normal de la mdula espinal del beb El folato puede ayudar a prevenir afecciones graves como la espina bfida. Si la dieta que consume no le proporciona los nutrientes adecuados, Programmer, multimedia con el mdico acerca del consumo de suplementos nutricionales.  CUL ES LA RELACIN ENTRE LOS TUMORES FIBROIDES Y LA ANEMIA EN LAS MUJERES?  La relacin suele estar ocasionada por un aumento en  la prdida de flujo menstrual ocasionado por los fibroides. Se requerir una buena ingesta de hierro para prevenir su deficiencia y Automotive engineer que se desarrolle la anemia.  Document Released: 09/24/2008 Document Revised: 06/17/2011 [facility] Patient Information 2012 [place], [place].

## 2011-11-16 NOTE — Progress Notes (Signed)
Patient presented to the office today to discuss recent lab work. Patient was seen in the office on March 25 for an annual gynecological examination and had been complaining of weight gain. She currently has a Tax adviser T380A IUD for contraception and does not recall when is time to have it removed and changed her for a new one. Patient's recent lab work as follows:  March 25 TSH 0.058 April 25 TSH 0.030 (normal T3 and T4) May 6 TSH 5.441 (normal T4)  Hemoglobin 11.0 Normal blood sugar and normal lipid profile Pap smear normal Mammogram patient contacted to followup for additional views of the left breast  Patient states that for many years she's had anemia and review of her record indicated that in 2011 by her primary care provider an anemia workup had demonstrated she had iron deficiency anemia whereas her total iron was low at 39 her saturation ratio was low 11.9 she had a normal vitamin B12 level. Her hemoglobin then was 11.1. She states that she has menstrual cycles last 4 days and they're not heavy. Patient's only complaint of been weight gain.  Assessment/plan: I will speak with Dr. Lurene Shadow endocrinologist because of this patient's of fluctuation and thyroid function test over a short course of days. 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 We'll obtain a thyroid peroxidase antibody test along with an anemia panel today. Patient will be instructed to start taking one iron tablet daily. Fecal occult blood testing cards provided for her to submit to the office for testing. I will contact the patient later in the week after I speak with the endocrinologist for any additional recommendations. Of note during her examination there was no evidence of any thyromegaly or any thyroid nodules palpated during recent annual GYN exam. Literature information was provided in Spanish to the patient on thyroid disease. All questions were answered and we'll follow accordingly.

## 2011-11-17 ENCOUNTER — Telehealth: Payer: Self-pay | Admitting: Gynecology

## 2011-11-17 NOTE — Telephone Encounter (Signed)
Patient was contacted today to inform her that I have spoken with Dr. Lurene Shadow endocrinologist who I would recommend she see  for further workup as to her thyroid dysfunction and normal chromic normocytic anemia (hemoglobin 11 g). Please see previous note dated May 7 for detail. A thyroid peroxidase antibody test was drawn yesterday result pending at time of this dictation but will be available along with all the other test for Dr. Lurene Shadow to see on epic.

## 2011-11-22 ENCOUNTER — Ambulatory Visit: Payer: BC Managed Care – PPO

## 2011-11-22 ENCOUNTER — Other Ambulatory Visit: Payer: Self-pay | Admitting: *Deleted

## 2011-11-22 ENCOUNTER — Encounter: Payer: Self-pay | Admitting: *Deleted

## 2011-11-22 ENCOUNTER — Ambulatory Visit (INDEPENDENT_AMBULATORY_CARE_PROVIDER_SITE_OTHER): Payer: BC Managed Care – PPO | Admitting: Gynecology

## 2011-11-22 ENCOUNTER — Encounter: Payer: Self-pay | Admitting: Gynecology

## 2011-11-22 VITALS — BP 120/78

## 2011-11-22 DIAGNOSIS — Z30430 Encounter for insertion of intrauterine contraceptive device: Secondary | ICD-10-CM

## 2011-11-22 DIAGNOSIS — Z30432 Encounter for removal of intrauterine contraceptive device: Secondary | ICD-10-CM

## 2011-11-22 DIAGNOSIS — E069 Thyroiditis, unspecified: Secondary | ICD-10-CM | POA: Insufficient documentation

## 2011-11-22 DIAGNOSIS — E079 Disorder of thyroid, unspecified: Secondary | ICD-10-CM

## 2011-11-22 DIAGNOSIS — Z3049 Encounter for surveillance of other contraceptives: Secondary | ICD-10-CM

## 2011-11-22 MED ORDER — TERCONAZOLE 0.4 % VA CREA: 1.0000 | TOPICAL_CREAM | Freq: Every day | VAGINAL | Status: AC

## 2011-11-22 NOTE — Patient Instructions (Signed)
Nos veremos en un mes. Si no recibes llamada de la encocrinologa me llamas de aqui al viernes.

## 2011-11-22 NOTE — Progress Notes (Signed)
Patient ID: Caitlin Dunlap, female   DOB: 1966/02/05, 46 y.o.   MRN: [id number] Patient informed by [facility] CMA that patient has appt with Dr. Talmage Nap on 11/30/11 @ 1:00 pm.

## 2011-11-22 NOTE — Progress Notes (Signed)
Patient presented to the office today to have her ParaGard T380A IUD removed since she did not know how long it's been since it was placed. She also is interested in having new one reinserted. Patient read the consent form and signed it and all questions were answered.  Exam: Pelvic: Bartholin urethra Skene was within normal limits Vagina: Vaginal blood present in the vault Cervix: IUD string seen with some menstrual blood Uterus: Anteverted normal size shape and consistency Adnexa: No palpable masses or tenderness Rectal: Not examined  Procedure note: The cervix was cleansed with Betadine solution. The IUD string was grasped with a ring forcep and retrieved shown to the patient and dis. The cervix was then cleansed with Betadine solution. A single-tooth tenaculum was placed on the anterior cervical lip. The uterus sounded to 6-1/2 cm and a new ParaGard T380A IUD was placed in sterile fashion the string was cut. The single-tooth tenaculum was removed. Patient tolerated procedure well with no complications.  Assessment/plan: Patient had outdated ParaGard T380A IUD removed and replaced with a new one today. Patient fully where this form of contraception is good for 10 years and is 99% effective. She is in the process of being evaluated by Dr. Lurene Shadow endocrinologist as a result of her suspected Hashimottos Thyroiditis. I did explain to her that was pending was the thyroid peroxidase level which is found to be elevated (375) which Dr. Lurene Shadow will discuss further with her and her upcoming consultation. Patient will return back in one month for followup.

## 2011-11-29 ENCOUNTER — Ambulatory Visit
Admission: RE | Admit: 2011-11-29 | Discharge: 2011-11-29 | Disposition: A | Discharge status: Home / Self Care | Payer: BC Managed Care – PPO | Source: Ambulatory Visit | Attending: Gynecology | Admitting: Gynecology

## 2011-11-29 DIAGNOSIS — N63 Unspecified lump in unspecified breast: Secondary | ICD-10-CM

## 2011-12-01 DIAGNOSIS — L83 Acanthosis nigricans: Secondary | ICD-10-CM | POA: Insufficient documentation

## 2011-12-01 DIAGNOSIS — L811 Chloasma: Secondary | ICD-10-CM | POA: Insufficient documentation

## 2011-12-09 ENCOUNTER — Other Ambulatory Visit: Payer: Self-pay | Admitting: Anesthesiology

## 2011-12-09 DIAGNOSIS — Z1211 Encounter for screening for malignant neoplasm of colon: Secondary | ICD-10-CM

## 2011-12-20 ENCOUNTER — Encounter: Payer: Self-pay | Admitting: Gynecology

## 2011-12-20 ENCOUNTER — Ambulatory Visit (INDEPENDENT_AMBULATORY_CARE_PROVIDER_SITE_OTHER): Payer: BC Managed Care – PPO | Admitting: Gynecology

## 2011-12-20 VITALS — BP 110/72

## 2011-12-20 DIAGNOSIS — N921 Excessive and frequent menstruation with irregular cycle: Secondary | ICD-10-CM

## 2011-12-20 DIAGNOSIS — Z30431 Encounter for routine checking of intrauterine contraceptive device: Secondary | ICD-10-CM

## 2011-12-20 DIAGNOSIS — R635 Abnormal weight gain: Secondary | ICD-10-CM

## 2011-12-20 DIAGNOSIS — Z975 Presence of (intrauterine) contraceptive device: Secondary | ICD-10-CM

## 2011-12-20 NOTE — Patient Instructions (Signed)
Control del colesterol  Los niveles de colesterol en el organismo estn determinados significativamente por su dieta. Los niveles de colesterol tambin se relacionan con la enfermedad cardaca. El material que sigue ayuda a explicar esta relacin y a analizar qu puede hacer para mantener su corazn sano. No todo el colesterol es malo. Las lipoprotenas de baja densidad (LDL) forman el colesterol "malo". El colesterol malo puede ocasionar depsitos de grasa que se acumulan en el interior de las arterias. Las lipoprotenas de alta densidad (HDL) es el colesterol "bueno". Ayuda a remover el colesterol LDL "malo" de la sangre. El colesterol es un factor de riesgo muy importante para la enfermedad cardaca. Otros factores de riesgo son la hipertensin arterial, el hbito de fumar, el estrs, la herencia y el peso.   El msculo cardaco obtiene el suministro de sangre a travs de las arterias coronarias. Si su colesterol LDL ("malo") est elevado y el HDL ("bueno") es bajo, tiene un factor de riesgo para que se formen depsitos de grasa en las arterias coronarias (los vasos sanguneos que suministran sangre al corazn). Esto hace que haya menos lugar para que la sangre circule. Sin la suficiente sangre y oxgeno, el msculo cardaco no puede funcionar correctamente, y usted podr sentir dolores en el pecho (angina pectoris). Cuando una arteria coronaria se cierra completamente, una parte del msculo cardaco puede morir (infarto de miocardio).  CONTROL DEL COLESTEROL Cuando el profesional que lo asiste enva la sangre al laboratorio para conocer el nivel de colesterol, puede realizarle tambin un perfil completo de los lpidos. Con esta prueba, se puede determinar la cantidad total de colesterol, as como los niveles de LDL y HDL. Los triglicridos son un tipo de grasa que circula en la sangre y que tambin puede utilizarse para determinar el riesgo de enfermedad  cardaca. En la siguiente tabla se establecen los nmeros ideales: Prueba: Colesterol total  Menos de 200 mg/dl.  Prueba: LDL "colesterol malo"  Menos de 100 mg/dl.   Menos de 70 mg/dl si tiene riesgo muy elevado de sufrir un ataque cardaco o muerte cardaca sbita.  Prueba: HDL "colesterol bueno"  Mujeres: Ms de 50 mg/dl.   Hombres: Ms de 40 mg/dl.  Prueba: Trigliceridos  Menos de 150 mg/dl.    CONTROL DEL COLESTEROL CON DIETA Aunque factores como el ejercicio y el estilo de vida son importantes, la "primera lnea de ataque" es la dieta. Esto se debe a que se sabe que ciertos alimentos hacen subir el colesterol y otros lo bajan. El objetivo debe ser equilibrar los alimentos, de modo que tengan un efecto sobre el colesterol y, an ms importante, reemplazar las grasas saturadas y trans con otros tipos de grasas, como las monoinsaturadas y las poliinsaturadas y cidos grasos omega-3 . En promedio, una persona no debe consumir ms de 15 a 17 g de grasas saturadas por da. Las grasas saturadas y trans se consideran grasas "malas", ya que elevan el colesterol LDL. Las grasas saturadas se encuentran principalmente en productos animales como carne, manteca y crema. Pero esto no significa que usted debe sacrificar todas sus comidas favoritas. Actualmente, como lo muestra el cuadro que figura al final de este documento, hay sustitutos de buen sabor, bajos en grasas y en colesterol, para la mayora de los alimentos que a usted le gusta comer. Elija aquellos alimentos alternativos que sean bajos en grasas o sin grasas. Elija cortes de carne del cuarto trasero o lomo ya que estos cortes son los que tienen menor cantidad de grasa   y colesterol. El pollo (sin piel), el pescado, la carne de ternera, y la pechuga de pavo molida son excelentes opciones. Elimine las carnes grasosas como los hotdogs o el salami. Los mariscos tienen poco o nada de grasas saturadas. Cuando consuma carne magra, carne de aves de  corral, o pescado, hgalo en porciones de 85 gramos (3 onzas). Las grasas trans tambin se llaman "aceites parcialmente hidrogenados". Son aceites manipulados cientficamente de modo que son slidos a temperatura ambiente, tienen una larga vida y mejoran el sabor y la textura de los alimentos a los que se agregan. Las grasas trans se encuentran en la margarina, masitas, crackers y alimentos horneados.  Para hornear y cocinar, el aceite es un excelente sustituto para la mantequilla. Los aceites monoinsaturados tienen un beneficio particular, ya que se cree que disminuyen el colesterol LDL (colesterol malo) y elevan el HDL. Deber evitar los aceites tropicales saturados como el de coco y el de palma.  Recuerde, adems, que puede comer sin restricciones los grupos de alimentos que son naturalmente libres de grasas saturadas y grasas trans, entre los que se incluyen el pescado, las frutas (excepto el aguacate), verduras, frijoles, cereales (cebada, arroz, cuzcuz, trigo) y las pastas (sin salsas con crema)   IDENTIFIQUE LOS ALIMENTOS QUE DISMINUYEN EL COLESTEROL  Pueden disminuir el colesterol las fibras solubles que estn en las frutas, como las manzanas, en los vegetales como el brcoli, las patatas y las zanahorias; en las legumbres como frijoles, guisantes y lentejas; y en los cereales como la cebada. Los alimentos fortificados con fitosteroles tambin pueden disminuir el colesterol. Debe consumir al menos 2 g de estos alimentos a diario para obtener el efecto de disminucin de colesterol.  En el supermercado, lea las etiquetas de los envases para identificar los alimentos bajos en grasas saturadas, libres de grasas trans y bajos en grasas, . Elija quesos que tengan solo de 2 a 3 g de grasa saturada por onza (28,35 g). Use una margarina que no dae el corazn, libre de grasas trans o aceite parcialmente hidrogenado. Al comprar alimentos horneados (galletitas dulces y galletas) evite el aceite parcialmente  hidrogenado. Los panes y bollos debern ser de granos enteros (harina de maz o de avena entera, en lugar de "harina" o "harina enriquecida"). Compre sopas en lata que no sean cremosas, con bajo contenido de sal y sin grasas adicionadas.   TCNICAS DE PREPARACIN DE LOS ALIMENTOS  Nunca fra los alimentos en aceite abundante. Si debe frer, hgalo en poco aceite y removiendo constantemente, porque as se utilizan muy pocas grasas, o utilice un spray antiadherente. Cuando le sea posible, hierva, hornee o ase las carnes y cocine los vegetales al vapor. En vez de aderezar los vegetales con mantequilla o margarina, utilice limn y hierbas, pur de manzanas y canela (para las calabazas y batatas), yogurt y salsa descremados y aderezos para ensaladas bajos en contenido graso.   BAJO EN GRASAS SATURADAS / SUSTITUTOS BAJOS EN GRASA  Carnes / Grasas saturadas (g)  Evite: Bife, corte graso (3 oz/85 g) / 11 g   Elija: Bife, corte magro (3 oz/85 g) / 4 g   Evite: Hamburguesa (3 oz/85 g) / 7 g   Elija:  [place]amburg[place]a magra (3 oz/85 g) / 5 g   Evite: Jamn (3 oz/85 g) / 6 g   Elija:  Jamn magro (3 oz/85 g) / 2.4 g   Evite: Pollo, con piel (3 oz/85 g), Carne oscura / 4 g   Elija:  Pollo, sin piel (  3 oz/85 g), Carne oscura / 2 g   Evite: Pollo, con piel (3 oz/85 g), Carne magra / 2.5 g   Elija: Pollo, sin piel (3 oz/85 g), Carne magra / 1 g  Lcteos / Grasas saturadas (g)  Evite: Leche entera (1 taza) / 5 g   Elija: Leche con bajo contenido de grasa, 2% (1 taza) / 3 g   Elija: Leche con bajo contenido de grasa, 1% (1 taza) / 1.5 g   Elija: Leche descremada (1 taza) / 0.3 g   Evite: Queso duro (1 oz/28 g) / 6 g   Elija: Queso descremado (1 oz/28 g) / 2-3 g   Evite: Queso cottage, 4% grasa (1 taza)/ 6.5 g   Elija: Queso cottage con bajo contenido de grasa, 1% grasa (1 taza)/ 1.5 g   Evite: Helado (1 taza) / 9 g   Elija: Sorbete (1 taza) / 2.5 g   Elija: Yogurt helado sin contenido de  grasa (1 taza) / 0.3 g   Elija: Barras de fruta congeladas / vestigios   Evite: Crema batida (1 cucharada) / 3.5 g   Elija: Batidos glac sin lcteos (1 cucharada) / 1 g  Condimentos / Grasas saturadas (g)  Evite: Mayonesa (1 cucharada) / 2 g   Elija: Mayonesa con bajo contenido de grasa (1 cucharada) / 1 g   Evite: Manteca (1 cucharada) / 7 g   Elija: Margarina extra light (1 cucharada) / 1 g   Evite: Aceite de coco (1 cucharada) / 11.8 g   Elija: Aceite de oliva (1 cucharada) / 1.8 g   Elija: Aceite de maz (1 cucharada) / 1.7 g   Elija: Aceite de crtamo (1 cucharada) / 1.2 g   Elija: Aceite de girasol (1 cucharada) / 1.4 g   Elija: Aceite de soja (1 cucharada) / 2.4 g   Elija: Aceite de canola (1 cucharada) / 1 g  Document Released: 06/28/2005 Document Revised: 03/10/2011 ExitCare Patient Information 2012 [employer]. Ejercicios para perder peso (Exercise to Lose Weight) La actividad fsica y una dieta saludable ayudan a perder peso. El mdico podr sugerirle ejercicios especficos. IDEAS Y CONSEJOS PARA HACER EJERCICIOS  Elija opciones econmicas que disfrute hacer , como caminar, andar en bicicleta o los vdeos para ejercitarse.   Utilice las escaleras en lugar del ascensor.   Camine durante la hora del almuerzo.   Estacione el auto lejos del lugar de trabajo o estudio.   Concurra a un gimnasio o tome clases de gimnasia.   Comience con 5  10 minutos de actividad fsica por da. Ejercite hasta 30 minutos, 4 a 6 das por semana.   Utilice zapatos que tengan un buen soporte y ropas cmodas.   Elongue antes y despus de ejercitar.   Ejercite hasta que aumente la respiracin y el corazn palpite rpido.   Beba agua extra cuando ejercite.   No haga ejercicio hasta lastimarse, sentirse mareado o que le falte mucho el aire.  La actividad fsica puede quemar alrededor de 150 caloras.  Correr 20 cuadras en 15 minutos.   Jugar vley durante 45 a 60  minutos.   Limpiar y encerar el auto durante 45 a 60 minutos.   Jugar ftbol americano de toque.   Caminar 25 cuadras en 35 minutos.   Empujar un cochecito 20 cuadras en 30 minutos.   Jugar baloncesto durante 30 minutos.   Rastrillar hojas secas durante 30 minutos.   Andar en bicicleta 80 cuadras en 30 minutos.     Caminar 30 cuadras en 30 minutos.   Bailar durante 30 minutos.   Quitar la nieve con una pala durante 15 minutos.   Nadar vigorosamente durante 20 minutos.   Subir escaleras durante 15 minutos.   Andar en bicicleta 60 cuadras durante 15 minutos.   Arreglar el jardn entre 30 y 45 minutos.   Saltar a la soga durante 15 minutos.   Limpiar vidrios o pisos durante 45 a 60 minutos.  Document Released: 10/02/2010 Document Revised: 03/10/2011 Exit[employer] Patient Information 2012 [employer]. 

## 2011-12-20 NOTE — Progress Notes (Signed)
Patient is a 46 year old who presented to the office today for one month followup after having replaced the ParaGard T380A IUD. Her only complaint is that she had some breakthrough bleeding this first month and her partner has felt the string. She also is going to work on exercising to lose weight.  Exam: Bartholin urethra Skene glands within normal limits Vagina: No lesions or discharge Cervix: IUD string seen and string trimmed Uterus: Anteverted normal size shape and consistency Adnexa: No palpable masses or tenderness  Assessment/plan: One month post IUD insertion doing well. Literature information on diet and exercise was provided in [place]. Patient to return to the office in one year for her annual exam or when necessary.

## 2011-12-21 NOTE — Progress Notes (Signed)
Error

## 2012-05-25 ENCOUNTER — Ambulatory Visit (INDEPENDENT_AMBULATORY_CARE_PROVIDER_SITE_OTHER): Payer: BC Managed Care – PPO | Admitting: Gynecology

## 2012-05-25 ENCOUNTER — Encounter: Payer: Self-pay | Admitting: Gynecology

## 2012-05-25 ENCOUNTER — Telehealth: Payer: Self-pay | Admitting: Anesthesiology

## 2012-05-25 DIAGNOSIS — N63 Unspecified lump in unspecified breast: Secondary | ICD-10-CM

## 2012-05-25 MED ORDER — IBUPROFEN 800 MG PO TABS: 800.0000 mg | ORAL_TABLET | Freq: Three times a day (TID) | ORAL | Status: DC | PRN

## 2012-05-25 MED ORDER — CEPHALEXIN 500 MG PO CAPS: 500.0000 mg | ORAL_CAPSULE | Freq: Four times a day (QID) | ORAL | Status: DC

## 2012-05-25 NOTE — Telephone Encounter (Signed)
U/S AND MAMMOGRAPHY APPT HAS BEEN MADE FOR 06/05/12 @ 9:00AM AT [facility] ... PATIENT HAS BEEN INFORMED OF APPT DATE AND TIME... ALL WAS EXPLAINED IN SPANISH.Marland KitchenMarland Kitchen

## 2012-05-25 NOTE — Telephone Encounter (Signed)
Message copied by Toma Aran on Thu May 25, 2012  4:11 PM ------      Message from: Dara Lords      Created: Thu May 25, 2012  3:45 PM       Schedule ultrasound/diagnostic mammography at the breast center reference inflammatory 7 cm mass left breast

## 2012-05-25 NOTE — Patient Instructions (Signed)
Take oral antibiotic 4 times daily. Take pain medicine 3 times daily Office will call you to arrange breast x-ray studies.

## 2012-05-25 NOTE — Progress Notes (Signed)
Patient presents with several day history of left breast mass tenderness swelling erythema. Reports several years ago a similar episode in the right breast which resolved. No history in the left breast before. No precipitating events such as trauma.  Blanca translated throughout the encounter.  Exam with [facility] assistant Both breast examined lying and sitting.  Left breast larger than right. Firm mass outer aspect approximately 7 cm across with overlying skin erythema. No fluctuance to suggest cystic or abscess. Tender to the patient. No axillary adenopathy nipple discharge skin retractions or other masses.  Right breast without masses retractions discharge adenopathy.  Assessment and plan: Left breast mass with mastitis/inflammatory characteristics. We'll start Keflex 500 mg 4 times a day, Motrin 800 mg 3 times a day, ultrasound possible mammogram in several days. Discussed possible referral to general surgery with biopsy. Unusual for mastitis and not breast feeding situation. Inflammatory carcinoma within the differential.  Absolute need for follow up stressed.

## 2012-05-31 ENCOUNTER — Encounter: Payer: Self-pay | Admitting: Gynecology

## 2012-05-31 ENCOUNTER — Ambulatory Visit (INDEPENDENT_AMBULATORY_CARE_PROVIDER_SITE_OTHER): Payer: BC Managed Care – PPO | Admitting: Gynecology

## 2012-05-31 VITALS — BP 106/70

## 2012-05-31 DIAGNOSIS — Z862 Personal history of diseases of the blood and blood-forming organs and certain disorders involving the immune mechanism: Secondary | ICD-10-CM

## 2012-05-31 DIAGNOSIS — N6320 Unspecified lump in the left breast, unspecified quadrant: Secondary | ICD-10-CM

## 2012-05-31 DIAGNOSIS — Z8639 Personal history of other endocrine, nutritional and metabolic disease: Secondary | ICD-10-CM

## 2012-05-31 DIAGNOSIS — D649 Anemia, unspecified: Secondary | ICD-10-CM

## 2012-05-31 DIAGNOSIS — Z800 Family history of malignant neoplasm of digestive organs: Secondary | ICD-10-CM

## 2012-05-31 DIAGNOSIS — N63 Unspecified lump in unspecified breast: Secondary | ICD-10-CM

## 2012-05-31 LAB — CBC WITH DIFFERENTIAL/PLATELET
Basophils Relative: 0 % (ref 0–1)
Eosinophils Absolute: 0.1 10*3/uL (ref 0.0–0.7)
HCT: 33.3 % — ABNORMAL LOW (ref 36.0–46.0)
Hemoglobin: 11 g/dL — ABNORMAL LOW (ref 12.0–15.0)
MCH: 28.7 pg (ref 26.0–34.0)
MCHC: 33 g/dL (ref 30.0–36.0)
Monocytes Absolute: 0.4 10*3/uL (ref 0.1–1.0)
Monocytes Relative: 6 % (ref 3–12)

## 2012-05-31 LAB — THYROID PANEL WITH TSH: T3 Uptake: 33.4 % (ref 22.5–37.0)

## 2012-05-31 NOTE — Progress Notes (Signed)
Patient is a 46 year old who presented to the office today for followup on the left breast mass/mastitis like picture. She was seen by my partner on November 14 in my absence as a result of patient complaining of tenderness and swelling and redness of the left breast. Patient stated she had similar episode on her right breast which had resolved. No prior history in her left breast. She was found during that exam that her left breast is larger than her right. Firm mass at her aspect of partially 7 cm across from the overlying skin and erythematous was noted but no fluctuance to suggest cystic are been an abscess. There was no reported axillary lymphadenopathy. She was treated for suspected possible mastitis with Keflex 500 mg 4 times a day along with Motrin 3 times a day. She scheduled for next week for an ultrasound and possible diagnostic mammogram of this left breast. She states her tenderness and redness is improved.  Mammogram from in this year results as follows: DIGITAL DIAGNOSTIC LEFT LIMITED MAMMOGRAM AND LEFT BREAST  ULTRASOUND:  Comparison: None.  Findings: Additional views confirm the presence of multiple  partially obscured partially circumscribed round and oval masses in  the left upper outer quadrant posteriorly.  On physical exam, no mass is palpated in the left upper outer  quadrant.  Ultrasound is performed, showing multiple cysts in the left upper  outer quadrant corresponding in size and location to the  mammographic nodules. There is no solid mass, distortion or  shadowing to suggest malignancy.  IMPRESSION:  Left breast cysts. No mammographic or sonographic evidence of  malignancy. Yearly screening mammography is suggested.   Exam: Both breasts were examined sitting supine position there was no nipple inversion no skin retraction no discoloration of either breast. Her right breast and no palpable masses or tenderness no supraclavicular axillary lymphadenopathy. Left breast  significant for a 7-8 cm like mass effect starting at the 3:00 position from the periareolar region. No supraclavicular axillary lymphadenopathy.  Patient has a ParaGard T380A IUD  Patient may of this year had thyroid function test ordered as a result of her complaining of weight gain and the following thyroid function tests were noted:  March 25 TSH 0.058  April 25 TSH 0.030 (normal T3 and T4)  May 6 TSH 5.441 (normal T4)  Hemoglobin 11.0 normal sugar and normal lipid profile and normal Pap smear.  She had been referred to the endocrinologist Dr. 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 She has informed me that she is not taking the medication wanted to wait to see if it will correct itself on her own. She's here also to repeat her thyroid function test. Of note her thyroid peroxidase level was  found to be elevated (375). We'll repeat this tested as well. We'll check her CBC today as well. Patient stated she's had chronic anemia most of her life. I've recommended to refer her to a gastroenterologist as well since with her IUD her periods are very light if any. If her thyroid function tests are abnormal she will be once again recommended to followup with Dr. Lurene Shadow endocrinologist. Will await the results of the ultrasound and diagnostic mammogram next week. She will continue with antibiotics to complete a 10 day course.

## 2012-06-01 ENCOUNTER — Encounter: Payer: Self-pay | Admitting: Internal Medicine

## 2012-06-01 ENCOUNTER — Telehealth: Payer: Self-pay | Admitting: *Deleted

## 2012-06-01 DIAGNOSIS — D649 Anemia, unspecified: Secondary | ICD-10-CM

## 2012-06-01 LAB — THYROID PEROXIDASE ANTIBODY: Thyroperoxidase Ab SerPl-aCnc: 194 IU/mL — ABNORMAL HIGH (ref <35.0)

## 2012-06-01 NOTE — Telephone Encounter (Signed)
Message copied by Aura Camps on Thu Jun 01, 2012  8:59 AM ------      Message from: Reynaldo Minium H      Created: Wed May 31, 2012  3:48 PM       Please schedule consult for December with Dr. Juanda Chance for colonoscopy on this patient with normochromic normocytic anemia. Thank you

## 2012-06-01 NOTE — Telephone Encounter (Signed)
appt on Jan. 22 @ 10:30 am with Dr. Juanda Chance. Pt will be informed by [facility].

## 2012-06-02 ENCOUNTER — Telehealth: Payer: Self-pay | Admitting: Gynecology

## 2012-06-02 NOTE — Telephone Encounter (Signed)
Telephone conversation with Dr. Lurene Shadow endocrinologist in reference to this patient's brittle Hashimoto's like thyroiditis. The following were her past thyroid function tests:  March 25 TSH 0.058  April 25 TSH 0.030 (normal T3 and T4)  May 6 TSH 5.441 (normal T4)  She had been referred to the endocrinologist Dr. 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 She has informed me that she is not taking the medication wanted to wait to see if it will correct itself on her own. She's here also to repeat her thyroid function test. Of note her thyroid peroxidase level was found to be elevated (375). Patient did not followup after the initial with her as had been recommended. She was seen here in the office on November 20 and the labs were repeated with the following results:  TSH normal 2.850  Total T4 normal 9.7 Free thyroxine index normal 3.2 Thyroid peroxidase antibody decreased down to 194  Dr. Lurene Shadow recommended just to repeat a TSH and if abnormal thyroid panel without the peroxidase antibody in one year. Will notify patient with the above information.

## 2012-06-05 ENCOUNTER — Ambulatory Visit
Admission: RE | Admit: 2012-06-05 | Discharge: 2012-06-05 | Disposition: A | Discharge status: Home / Self Care | Payer: BC Managed Care – PPO | Source: Ambulatory Visit | Attending: Gynecology | Admitting: Gynecology

## 2012-06-05 DIAGNOSIS — N63 Unspecified lump in unspecified breast: Secondary | ICD-10-CM

## 2012-07-06 ENCOUNTER — Encounter: Payer: Self-pay | Admitting: *Deleted

## 2012-08-02 ENCOUNTER — Other Ambulatory Visit (INDEPENDENT_AMBULATORY_CARE_PROVIDER_SITE_OTHER): Payer: BC Managed Care – PPO

## 2012-08-02 ENCOUNTER — Ambulatory Visit (INDEPENDENT_AMBULATORY_CARE_PROVIDER_SITE_OTHER): Payer: BC Managed Care – PPO | Admitting: Internal Medicine

## 2012-08-02 ENCOUNTER — Encounter: Payer: Self-pay | Admitting: Internal Medicine

## 2012-08-02 VITALS — BP 100/62 | HR 84 | Ht 63.5 in | Wt 153.4 lb

## 2012-08-02 DIAGNOSIS — D649 Anemia, unspecified: Secondary | ICD-10-CM

## 2012-08-02 DIAGNOSIS — D509 Iron deficiency anemia, unspecified: Secondary | ICD-10-CM

## 2012-08-02 LAB — CBC WITH DIFFERENTIAL/PLATELET
Eosinophils Relative: 1.2 % (ref 0.0–5.0)
HCT: 35.1 % — ABNORMAL LOW (ref 36.0–46.0)
Hemoglobin: 11.7 g/dL — ABNORMAL LOW (ref 12.0–15.0)
Lymphocytes Relative: 29.1 % (ref 12.0–46.0)
Lymphs Abs: 2.1 10*3/uL (ref 0.7–4.0)
Monocytes Relative: 7 % (ref 3.0–12.0)
Platelets: 290 10*3/uL (ref 150.0–400.0)
WBC: 7.3 10*3/uL (ref 4.5–10.5)

## 2012-08-02 LAB — IBC PANEL
Iron: 113 ug/dL (ref 42–145)
Saturation Ratios: 34.1 % (ref 20.0–50.0)

## 2012-08-02 MED ORDER — INTEGRA 62.5-62.5-40-3 MG PO CAPS: 1.0000 | ORAL_CAPSULE | Freq: Every day | ORAL | Status: DC

## 2012-08-02 MED ORDER — MOVIPREP 100 G PO SOLR: 1.0000 | Freq: Once | ORAL | Status: DC

## 2012-08-02 NOTE — Patient Instructions (Addendum)
You have been scheduled for an endoscopy and colonoscopy with propofol. Please follow the written instructions given to you at your visit today. Please pick up your prep at the pharmacy within the next 1-3 days. If you use inhalers (even only as needed) or a CPAP machine, please bring them with you on the day of your procedure.  We have sent the following medications to your pharmacy for you to pick up at your convenience: Integra  Your physician has requested that you go to the basement for the following lab work before leaving today: CBC, IBC, TSH, Celiac 10 Panel  CC: Dr Lily Peer

## 2012-08-02 NOTE — Progress Notes (Signed)
Caitlin Dunlap [birth year] MRN [id number]  History of Present Illness:  This is a 47 year old, Hispanic female who does not speak [place]. She comes with an interpreter for evaluation of iron deficiency anemia. On 05/31/2012, her hemoglobin was 11.0, hematocrit was 33.3 and MCV is 86. In May 2013, serum iron was 96 with a TIBC of 240 and 29% saturation. Patient was taking iron at that time. She denies any abdominal pain, visible blood per rectum or hematemesis. She reports being anemic since she was a teenager. She had 3 pregnancies during which she took iron supplements. Her anemia has always been corrected with iron. Her periods are not heavy and they last 4 days. She took ibuprofen intermittently for breast inflammation and some for headaches.There is no family history of anemia. She has never received blood transfusions and never donated blood. Her eating habits are regular including vegetables, meat and fruit.   Past Medical History  Diagnosis Date  . Hashimoto's thyroiditis   . Anemia   . Migraine   . 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 Lipoma resection     FROM RIGHT UNDERARM    reports that she has never smoked. She has never used smokeless tobacco. She reports that she does not drink alcohol or use illicit drugs. family history includes Diabetes in her maternal aunt. No Known Allergies      Review of Systems: Negative for dysphagia, odynophagia, epigastric pain, nausea  The remainder of the 10 point ROS is negative except as outlined in H&P   Physical Exam: General appearance  Well developed, in no distress. Eyes- non icteric. HEENT nontraumatic, normocephalic. Mouth no lesions, tongue papillated, no cheilosis. Neck supple without adenopathy, thyroid not enlarged, no carotid bruits, no JVD. Lungs Clear to auscultation bilaterally. Cor normal S1, normal S2, regular rhythm, no murmur,  quiet precordium. Abdomen: Soft relaxed nontender  with normoactive bowel sounds. No distention. Liver edge at costal margin. Rectal: Normal perianal area. Normal rectal sphincter tone. Small amount of Hemoccult negative stool. Extremities no pedal edema. Skin no lesions. Neurological alert and oriented x 3. Psychological normal mood and affect.  Assessment and Plan:  Problem #1 History of iron deficiency anemia corrected with oral iron. Patient remains asymptomatic as to the source of the bleeding. She claims to have light periods. She is 47 years old and there is no family history of colorectal neoplasm or inflammatory bowel disease. The possibilityies include iron malabsorption such as in celiac disease, decreased dietary intake although she claims to have balanced diet or low-grade GI blood loss from AVMs. There could also be vascular lesions of the small bowel, polyps in the colon or very unlikely, a malignancy. A large hiatal hernia can also cause chronic GI blood loss although she gives no specific history of reflux symptoms. In order to have this evaluated, we will proceed with an upper endoscopy and colonoscopy. I have explained both procedures to the patient with the help of the interpreter. She agrees to proceed. In the future, we may consider a small bowel capsule endoscopy depending on the results of the endoscopic studies. We will give her a trial of Integra and she will go to the lab to get a celiac panel, TSH and CBC with repeat iron studies.   08/02/2012 Caitlin Dunlap

## 2012-08-03 LAB — CELIAC PANEL 10
Endomysial Screen: NEGATIVE
Gliadin IgA: 6.3 U/mL (ref <20)
IgA: 317 mg/dL (ref 69–380)

## 2012-08-08 ENCOUNTER — Encounter: Payer: Self-pay | Admitting: Internal Medicine

## 2012-08-08 ENCOUNTER — Ambulatory Visit (AMBULATORY_SURGERY_CENTER): Payer: BC Managed Care – PPO | Admitting: Internal Medicine

## 2012-08-08 VITALS — BP 95/68 | HR 67 | Temp 96.5°F | Resp 12 | Ht 63.0 in | Wt 153.0 lb

## 2012-08-08 DIAGNOSIS — Z800 Family history of malignant neoplasm of digestive organs: Secondary | ICD-10-CM

## 2012-08-08 DIAGNOSIS — D1330 Benign neoplasm of unspecified part of small intestine: Secondary | ICD-10-CM

## 2012-08-08 DIAGNOSIS — D638 Anemia in other chronic diseases classified elsewhere: Secondary | ICD-10-CM

## 2012-08-08 DIAGNOSIS — K319 Disease of stomach and duodenum, unspecified: Secondary | ICD-10-CM

## 2012-08-08 DIAGNOSIS — D649 Anemia, unspecified: Secondary | ICD-10-CM

## 2012-08-08 MED ORDER — SODIUM CHLORIDE 0.9 % IV SOLN: 500.0000 mL | INTRAVENOUS | Status: DC

## 2012-08-08 NOTE — Progress Notes (Addendum)
Patient did not have preoperative order for IV antibiotic SSI prophylaxis. (G8918)  Patient did not experience any of the following events: a burn prior to discharge; a fall within the facility; wrong site/side/patient/procedure/implant event; or a hospital transfer or hospital admission upon discharge from the facility. ([id number])  

## 2012-08-08 NOTE — Op Note (Signed)
[facility] [address] [place], [address]   COLONOSCOPY PROCEDURE REPORT  PATIENT: Caitlin, Dunlap  MR#: [id number] BIRTHDATE: 04-08-1966 , 46  yrs. old GENDER: Female ENDOSCOPIST: Hart Carwin, MD REFERRED BY:  Reynaldo Minium, M.D. PROCEDURE DATE:  08/08/2012 PROCEDURE:   Colonoscopy, screening ASA CLASS:   Class I INDICATIONS:Iron Deficiency Anemia. MEDICATIONS: MAC sedation, administered by CRNA and propofol (Diprivan) 400mg  IV  DESCRIPTION OF PROCEDURE:   After the risks and benefits and of the procedure were explained, informed consent was obtained.  A digital rectal exam revealed no abnormalities of the rectum.    The LB PCF-H180AL [id number]  endoscope was introduced through the anus and advanced to the cecum, which was identified by both the appendix and ileocecal valve .  The quality of the prep was excellent, using MoviPrep .  The instrument was then slowly withdrawn as the colon was fully examined.     COLON FINDINGS: A normal appearing cecum, ileocecal valve, and appendiceal orifice were identified.  The ascending, hepatic flexure, transverse, splenic flexure, descending, sigmoid colon and rectum appeared unremarkable.  No polyps or cancers were seen. Retroflexed views revealed no abnormalities.     The scope was then withdrawn from the patient and the procedure completed.  COMPLICATIONS: There were no complications. ENDOSCOPIC IMPRESSION: Normal colon  RECOMMENDATIONS: High fiber diet repeat hemoccult cards and follow H/H, if positive stool cards then consider SBCEndoscopy to look for avm's   REPEAT EXAM: In 10 year(s)  for Colonoscopy.  cc:  _______________________________ eSignedHart Carwin, MD 08/08/2012 2:15 PM

## 2012-08-08 NOTE — Progress Notes (Signed)
Called to room to assist during endoscopic procedure.  Patient ID and intended procedure confirmed with present staff. Received instructions for my participation in the procedure from the performing physician.  

## 2012-08-08 NOTE — Patient Instructions (Addendum)
YOU HAD AN ENDOSCOPIC PROCEDURE TODAY AT THE [facility]: Refer to the procedure report that was given to you for any specific questions about what was found during the examination.  If the procedure report does not answer your questions, please call your gastroenterologist to clarify.  If you requested that your care partner not be given the details of your procedure findings, then the procedure report has been included in a sealed envelope for you to review at your convenience later.  YOU SHOULD EXPECT: Some feelings of bloating in the abdomen. Passage of more gas than usual.  Walking can help get rid of the air that was put into your GI tract during the procedure and reduce the bloating. If you had a lower endoscopy (such as a colonoscopy or flexible sigmoidoscopy) you may notice spotting of blood in your stool or on the toilet paper. If you underwent a bowel prep for your procedure, then you may not have a normal bowel movement for a few days.  DIET: Your first meal following the procedure should be a light meal and then it is ok to progress to your normal diet.  A half-sandwich or bowl of soup is an example of a good first meal.  Heavy or fried foods are harder to digest and may make you feel nauseous or bloated.  Likewise meals heavy in dairy and vegetables can cause extra gas to form and this can also increase the bloating.  Drink plenty of fluids but you should avoid alcoholic beverages for 24 hours.  ACTIVITY: Your care partner should take you home directly after the procedure.  You should plan to take it easy, moving slowly for the rest of the day.  You can resume normal activity the day after the procedure however you should NOT DRIVE or use heavy machinery for 24 hours (because of the sedation medicines used during the test).    SYMPTOMS TO REPORT IMMEDIATELY: A gastroenterologist can be reached at any hour.  During normal business hours, 8:30 AM to 5:00 PM Monday through Friday,  call [phone].  After hours and on weekends, please call the GI answering service at [phone] who will take a message and have the physician on call contact you.   Following lower endoscopy (colonoscopy or flexible sigmoidoscopy):  Excessive amounts of blood in the stool  Significant tenderness or worsening of abdominal pains  Swelling of the abdomen that is new, acute  Fever of 100F or higher  Following upper endoscopy (EGD)  Vomiting of blood or coffee ground material  New chest pain or pain under the shoulder blades  Painful or persistently difficult swallowing  New shortness of breath  Fever of 100F or higher  Black, tarry-looking stools  FOLLOW UP: If any biopsies were taken you will be contacted by phone or by letter within the next 1-3 weeks.  Call your gastroenterologist if you have not heard about the biopsies in 3 weeks.  Our staff will call the home number listed on your records the next business day following your procedure to check on you and address any questions or concerns that you may have at that time regarding the information given to you following your procedure. This is a courtesy call and so if there is no answer at the home number and we have not heard from you through the emergency physician on call, we will assume that you have returned to your regular daily activities without incident.  SIGNATURES/CONFIDENTIALITY: You and/or your care   partner have signed paperwork which will be entered into your electronic medical record.  These signatures attest to the fact that that the information above on your After Visit Summary has been reviewed and is understood.  Full responsibility of the confidentiality of this discharge information lies with you and/or your care-partner.  

## 2012-08-08 NOTE — Progress Notes (Signed)
Lidocaine-40mg IV prior to Propofol InductionPropofol given over incremental dosages 

## 2012-08-08 NOTE — Op Note (Signed)
[facility] [address] [place], [address]   ENDOSCOPY PROCEDURE REPORT  PATIENT: Caitlin Dunlap, Caitlin Dunlap  MR#: [id number] BIRTHDATE: 02/16/1966 , 46  yrs. old GENDER: Female ENDOSCOPIST: Hart Carwin, MD REFERRED BY:  Reynaldo Minium, M.D. PROCEDURE DATE:  08/08/2012 PROCEDURE:  EGD w/ biopsy ASA CLASS:     Class I INDICATIONS:  Iron deficiency anemia. MEDICATIONS: MAC sedation, administered by CRNA and propofol (Diprivan) 100mg  IV TOPICAL ANESTHETIC: none  DESCRIPTION OF PROCEDURE: After the risks benefits and alternatives of the procedure were thoroughly explained, informed consent was obtained.  The LB GIF-H180 [id number] endoscope was introduced through the mouth and advanced to the second portion of the duodenum. Without limitations.   Small bowl biopsies were obtained to r/o sprue. The instrument was slowly withdrawn as the mucosa was fully examined.      There was an umbilicated submucosal nodule in the gastric antrum, 15 mm in diameter, it was soft, biopsies were obtain, there were no stibmata of bleeding,       Retroflexed views revealed no abnormalities.     The scope was then withdrawn from the patient and the procedure completed.  COMPLICATIONS: There were no complications. 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

## 2012-08-09 ENCOUNTER — Telehealth: Payer: Self-pay | Admitting: *Deleted

## 2012-08-09 NOTE — Telephone Encounter (Signed)
No answer, left message to call if questions or concerns. 

## 2012-08-14 ENCOUNTER — Other Ambulatory Visit: Payer: Self-pay | Admitting: *Deleted

## 2012-08-14 ENCOUNTER — Encounter: Payer: Self-pay | Admitting: Internal Medicine

## 2012-08-14 DIAGNOSIS — K921 Melena: Secondary | ICD-10-CM

## 2012-09-19 ENCOUNTER — Encounter: Payer: Self-pay | Admitting: *Deleted

## 2012-10-02 ENCOUNTER — Telehealth: Payer: Self-pay | Admitting: *Deleted

## 2012-10-02 NOTE — Telephone Encounter (Signed)
Dr. Juanda Chance, this patient has not returned her hemoccult cards to [place]. I have mailed her a letter and she still has not returned them.

## 2012-10-02 NOTE — Telephone Encounter (Signed)
Message copied by Daphine Deutscher on Mon Oct 02, 2012  8:28 AM ------      Message from: Daphine Deutscher      Created: Tue Sep 19, 2012  9:08 AM       Did patient do hemoccult cards DB Letter mailed ------

## 2012-12-19 ENCOUNTER — Other Ambulatory Visit: Payer: Self-pay

## 2012-12-19 DIAGNOSIS — Z1231 Encounter for screening mammogram for malignant neoplasm of breast: Secondary | ICD-10-CM

## 2013-01-24 ENCOUNTER — Ambulatory Visit
Admission: RE | Admit: 2013-01-24 | Discharge: 2013-01-24 | Disposition: A | Discharge status: Home / Self Care | Payer: BC Managed Care – PPO | Source: Ambulatory Visit

## 2013-01-24 DIAGNOSIS — Z1231 Encounter for screening mammogram for malignant neoplasm of breast: Secondary | ICD-10-CM

## 2013-12-31 ENCOUNTER — Other Ambulatory Visit: Payer: Self-pay

## 2013-12-31 DIAGNOSIS — Z1231 Encounter for screening mammogram for malignant neoplasm of breast: Secondary | ICD-10-CM

## 2014-01-03 ENCOUNTER — Encounter: Payer: Self-pay | Admitting: Gynecology

## 2014-01-03 ENCOUNTER — Ambulatory Visit (INDEPENDENT_AMBULATORY_CARE_PROVIDER_SITE_OTHER): Payer: BC Managed Care – PPO | Admitting: Gynecology

## 2014-01-03 VITALS — BP 106/66 | Ht 63.75 in | Wt 149.8 lb

## 2014-01-03 DIAGNOSIS — Z01419 Encounter for gynecological examination (general) (routine) without abnormal findings: Secondary | ICD-10-CM

## 2014-01-03 DIAGNOSIS — E079 Disorder of thyroid, unspecified: Secondary | ICD-10-CM

## 2014-01-03 DIAGNOSIS — R51 Headache: Secondary | ICD-10-CM

## 2014-01-03 DIAGNOSIS — R55 Syncope and collapse: Secondary | ICD-10-CM

## 2014-01-03 DIAGNOSIS — D509 Iron deficiency anemia, unspecified: Secondary | ICD-10-CM

## 2014-01-03 NOTE — Progress Notes (Signed)
Caitlin Dunlap 11/08/1965 [id number]   History:    48 y.o.  for annual gyn exam who has not been seen in the office since 2013 for her annual exam. Patient has a [place] IUD that was placed in 2013. Patient reports normal cycles lasting 4-5 days. Patient has had history of iron deficiency anemia. She has been evaluated by Dr. Elicia Lamp who did her EGD and colonoscopy both reported to be normal. Patient was on supplemental iron but stopped a month ago when she ran out.  The patient brought to my attention that a month ago coming out of the shower she lost consciousness and fainted. This was witnessed by her daughter. She did not seek any medical attention afterwards. She denies any recurrent episodes. She denies any shortness of breath, or tightness in her chest or numbness or tingling to extremities. Patient does suffer times for migraine headaches.  Patient also had been diagnosed in the past with Hashimoto's thyroiditis and had been referred to the endocrinologist Dr. Bubba Camp. She is no longer on any medication and has not seen her in quite some time. Patient with no Prior history  of abnormal Pap smear. Patient was weighing 156 pounds and is currently down to 149 pounds she states she attributes to exercise again healthier.   Past medical history,surgical history, family history and social history were all reviewed and documented in the EPIC chart.  Gynecologic History Patient's last menstrual period was 12/31/2013. Contraception: IUD Last Pap:  2013 . Results were: normal Last mammogram:  2014 . Results were: normal but dense   Obstetric History OB History  Gravida Para Term Preterm AB SAB TAB Ectopic Multiple Living  3 3 3       3     # Outcome Date GA Lbr Len/2nd Weight Sex Delivery Anes PTL Lv  3 TRM     M SVD  N Y  2 TRM     F SVD  N Y  1 TRM     M SVD  N Y       ROS: A ROS was performed and pertinent positives and negatives are included in the history.  GENERAL: No  fevers or chills. HEENT: No change in vision, no earache, sore throat or sinus congestion. NECK: No pain or stiffness. CARDIOVASCULAR: No chest pain or pressure. No palpitations. PULMONARY: No shortness of breath, cough or wheeze. GASTROINTESTINAL: No abdominal pain, nausea, vomiting or diarrhea, melena or bright red blood per rectum. GENITOURINARY: No urinary frequency, urgency, hesitancy or dysuria. MUSCULOSKELETAL: No joint or muscle pain, no back pain, no recent trauma. DERMATOLOGIC: No rash, no itching, no lesions. ENDOCRINE: No polyuria, polydipsia, no heat or cold intolerance. No recent change in weight. HEMATOLOGICAL: No anemia or easy bruising or bleeding. NEUROLOGIC: No headache, seizures, numbness, tingling or weakness. PSYCHIATRIC: No depression, no loss of interest in normal activity or change in sleep pattern.     Exam: chaperone present  BP 106/66  Ht 5' 3.75" (1.619 m)  Wt 149 lb 12.8 oz (67.949 kg)  BMI 25.92 kg/m2  LMP 12/31/2013  Body mass index is 25.92 kg/(m^2).  General appearance : Well developed well nourished female. No acute distress HEENT: Neck supple, trachea midline, no carotid bruits, no thyroidmegaly Lungs: Clear to auscultation, no rhonchi or wheezes, or rib retractions  Heart: Regular rate and rhythm, no murmurs or gallops Breast:Examined in sitting and supine position were symmetrical in appearance, no palpable masses or tenderness,  no skin retraction, no  nipple inversion, no nipple discharge, no skin discoloration, no axillary or supraclavicular lymphadenopathy Abdomen: no palpable masses or tenderness, no rebound or guarding Extremities: no edema or skin discoloration or tenderness  Pelvic:  Bartholin, Urethra, Skene Glands: Within normal limits             Vagina: No gross lesions or discharge  Cervix: No gross lesions or discharge, IUD string seen   Uterus   anteverted , normal size, shape and consistency, non-tender and mobile  Adnexa  Without masses  or tenderness  Anus and perineum  normal   Rectovaginal  normal sphincter tone without palpated masses or tenderness             Hemoccult not indicated     Assessment/Plan:  48 y.o. female for annual exam With history of chronic iron deficiency anemia. Negative colonoscopy and EGD last year. Patient was on supplemental iron until one month ago. She will return to the office next week in the fasting state for the following labs: CBC, fasting lipid profile, comprehensive metabolic panel, TSH, and urinalysis. Pap smear was not done today in accordance to new guidelines. Patient will be referred to the neurologist Dr. Melton Alar for further assessment of headaches and the isolated syncope episode. Patient was reminded to do her monthly [place] as exam. Next year when she has her mammogram she should request for a 3-D due to the fact that this years mammogram demonstrated her breasts were dense.   Note: This dictation was prepared with  Dragon/digital dictation along withSmart phrase technology. Any transcriptional errors that result from this process are unintentional.   Terrance Mass MD, 12:20 PM 01/03/2014

## 2014-01-03 NOTE — Patient Instructions (Signed)
Dolor de cabeza general sin causa  °(General Headache Without Cause)  ° EL dolor de cabeza es un dolor o malestar que se siente en la zona de la cabeza o del cuello. Puede no tener una causa específica. Hay muchas causas y tipos de dolores de cabeza. Los más comunes son:  °· Cefalea tensional. °· Cefaleas migrañosas. °· Cefalea en brotes. °· Cefaleas diarias crónicas. °INSTRUCCIONES PARA EL CUIDADO EN EL HOGAR  °· Cumpla con todas las citas programadas con su médico o con el especialista al que lo hayan derivado. °· Sólo tome medicamentos de venta libre o recetados para calmar el dolor o el malestar, según las indicaciones de su médico. °· Cuando sienta dolor de cabeza acuéstese en un cuarto oscuro y tranquilo. °· Lleve un registro diario para averiguar lo que puede provocarlo. Por ejemplo, escriba: °¨ Lo que come y bebe. °¨ Cuánto tiempo duerme. °¨ Todo cambio en la dieta o medicamentos. °· Intente con masajes u otras técnicas de relajación. °· Colóquese compresas de hielo o calor en la cabeza y en el cuello. Úselos 3 a 4 veces por día de 15 a 20 minutos por vez, o como sea necesario. °· Limite las situaciones de estrés. °· Siéntese con la espalda recta y no  tense los músculos. °· Si fuma, deje de hacerlo. °· Limite el consumo de bebidas alcohólicas. °· Consuma menos cantidad de cafeína o deje de tomarla. °· Coma y duerma en horarios regulares. °· Duerma entre 7 y 9 horas o como lo indique su médico. °· Mantenga las luces tenues si le molestan las luces brillantes y empeoran el dolor de cabeza. °SOLICITE ATENCIÓN MÉDICA SI:  °· Tiene problemas con los medicamentos que le recetaron. °· Los medicamentos no le hacen efecto. °· El dolor de cabeza que sentía habitualmente es diferente. °· Tiene náuseas o vómitos. °SOLICITE ATENCIÓN MÉDICA DE INMEDIATO SI:  °· El dolor se hace cada vez más intenso. °· Tiene fiebre. °· Presenta rigidez en el cuello. °· Sufre pérdida de la visión. °· Presenta debilidad muscular o pérdida  del control muscular. °· Comienza a perder el equilibrio o tiene problemas para caminar. °· Sufre mareos o se desmaya. °· Tiene síntomas graves que son diferentes a los primeros síntomas. °ASEGÚRESE DE QUE:  °· Comprende estas instrucciones. °· Controlará su enfermedad. °· Solicitará ayuda de inmediato si no mejora o empeora. °Document Released: 04/07/2005 Document Revised: 12/28/2011 °ExitCare® Patient Information ©2015 [employer]. This information is not intended to replace advice given to you by your health care provider. Make sure you discuss any questions you have with your health care provider. ° °

## 2014-01-14 ENCOUNTER — Other Ambulatory Visit: Payer: BC Managed Care – PPO

## 2014-01-14 DIAGNOSIS — E079 Disorder of thyroid, unspecified: Secondary | ICD-10-CM

## 2014-01-14 DIAGNOSIS — Z01419 Encounter for gynecological examination (general) (routine) without abnormal findings: Secondary | ICD-10-CM

## 2014-01-14 LAB — COMPREHENSIVE METABOLIC PANEL
ALT: 10 U/L (ref 0–35)
AST: 11 U/L (ref 0–37)
Albumin: 4.1 g/dL (ref 3.5–5.2)
Alkaline Phosphatase: 49 U/L (ref 39–117)
BUN: 13 mg/dL (ref 6–23)
CALCIUM: 8.6 mg/dL (ref 8.4–10.5)
CHLORIDE: 101 meq/L (ref 96–112)
CO2: 27 meq/L (ref 19–32)
CREATININE: 0.62 mg/dL (ref 0.50–1.10)
GLUCOSE: 84 mg/dL (ref 70–99)
Potassium: 3.8 mEq/L (ref 3.5–5.3)
Sodium: 137 mEq/L (ref 135–145)
Total Bilirubin: 0.5 mg/dL (ref 0.2–1.2)
Total Protein: 6.8 g/dL (ref 6.0–8.3)

## 2014-01-14 LAB — LIPID PANEL
CHOL/HDL RATIO: 4.6 ratio
Cholesterol: 167 mg/dL (ref 0–200)
HDL: 36 mg/dL — ABNORMAL LOW (ref >39)
LDL CALC: 113 mg/dL — AB (ref 0–99)
Triglycerides: 88 mg/dL (ref <150)
VLDL: 18 mg/dL (ref 0–40)

## 2014-01-14 LAB — CBC WITH DIFFERENTIAL/PLATELET
Basophils Absolute: 0 10*3/uL (ref 0.0–0.1)
Basophils Relative: 0 % (ref 0–1)
EOS PCT: 2 % (ref 0–5)
Eosinophils Absolute: 0.1 10*3/uL (ref 0.0–0.7)
HEMATOCRIT: 34.1 % — AB (ref 36.0–46.0)
Hemoglobin: 11.6 g/dL — ABNORMAL LOW (ref 12.0–15.0)
LYMPHS ABS: 1.7 10*3/uL (ref 0.7–4.0)
LYMPHS PCT: 32 % (ref 12–46)
MCH: 28.9 pg (ref 26.0–34.0)
MCHC: 34 g/dL (ref 30.0–36.0)
MCV: 84.8 fL (ref 78.0–100.0)
MONO ABS: 0.4 10*3/uL (ref 0.1–1.0)
MONOS PCT: 7 % (ref 3–12)
Neutro Abs: 3.1 10*3/uL (ref 1.7–7.7)
Neutrophils Relative %: 59 % (ref 43–77)
Platelets: 268 10*3/uL (ref 150–400)
RBC: 4.02 MIL/uL (ref 3.87–5.11)
RDW: 13.6 % (ref 11.5–15.5)
WBC: 5.2 10*3/uL (ref 4.0–10.5)

## 2014-01-14 LAB — TSH: TSH: 2.701 u[IU]/mL (ref 0.350–4.500)

## 2014-02-06 ENCOUNTER — Encounter (INDEPENDENT_AMBULATORY_CARE_PROVIDER_SITE_OTHER): Payer: Self-pay

## 2014-02-06 ENCOUNTER — Ambulatory Visit
Admission: RE | Admit: 2014-02-06 | Discharge: 2014-02-06 | Disposition: A | Discharge status: Home / Self Care | Payer: BC Managed Care – PPO | Source: Ambulatory Visit

## 2014-02-06 DIAGNOSIS — Z1231 Encounter for screening mammogram for malignant neoplasm of breast: Secondary | ICD-10-CM

## 2014-05-13 ENCOUNTER — Encounter: Payer: Self-pay | Admitting: Gynecology

## 2016-07-20 ENCOUNTER — Other Ambulatory Visit: Payer: Self-pay | Admitting: Gynecology

## 2016-08-06 ENCOUNTER — Encounter: Payer: Self-pay | Admitting: Women's Health

## 2016-08-06 ENCOUNTER — Other Ambulatory Visit: Payer: Self-pay | Admitting: Women's Health

## 2016-08-06 ENCOUNTER — Ambulatory Visit (INDEPENDENT_AMBULATORY_CARE_PROVIDER_SITE_OTHER): Payer: BLUE CROSS/BLUE SHIELD | Admitting: Women's Health

## 2016-08-06 ENCOUNTER — Encounter: Payer: Self-pay | Admitting: Gynecology

## 2016-08-06 VITALS — Ht 63.75 in | Wt 159.0 lb

## 2016-08-06 DIAGNOSIS — Z01419 Encounter for gynecological examination (general) (routine) without abnormal findings: Secondary | ICD-10-CM | POA: Diagnosis not present

## 2016-08-06 DIAGNOSIS — R350 Frequency of micturition: Secondary | ICD-10-CM | POA: Diagnosis not present

## 2016-08-06 DIAGNOSIS — Z1329 Encounter for screening for other suspected endocrine disorder: Secondary | ICD-10-CM

## 2016-08-06 DIAGNOSIS — Z79890 Hormone replacement therapy (postmenopausal): Secondary | ICD-10-CM

## 2016-08-06 DIAGNOSIS — Z13220 Encounter for screening for lipoid disorders: Secondary | ICD-10-CM

## 2016-08-06 DIAGNOSIS — Z1231 Encounter for screening mammogram for malignant neoplasm of breast: Secondary | ICD-10-CM

## 2016-08-06 LAB — HM PAP SMEAR

## 2016-08-06 MED ORDER — PROGESTERONE MICRONIZED 200 MG PO CAPS: ORAL_CAPSULE | ORAL | 4 refills | Status: DC

## 2016-08-06 MED ORDER — ESTRADIOL 0.05 MG/24HR TD PTWK: 0.0500 mg | MEDICATED_PATCH | TRANSDERMAL | 4 refills | Status: DC

## 2016-08-06 NOTE — Progress Notes (Signed)
Caitlin Dunlap 27-Oct-1965 [id number]    History:    Presents for annual exam.  2013 [place] IUD amenorrheic 1 year with 1 episode of 2 day spotting. Same partner. Numerous hot flushes, poor sleep, no libido, mild stress incontinence. Overdue for mammogram and Pap, lack of insurance in the past. Also states is having discomfort in her legs from varicose veins. Normal Pap and mammogram history. History of positive thyroid antibodies with normal TSH,, evaluated by endocrinologist with no further treatment. Normal colonoscopy and EGD because of a history of anemia. Blanca  interpreted  Past medical history, past surgical history, family history and social history were all reviewed and documented in the EPIC chart. Homemaker, 3 children are grown doing well. No family history of diabetes  ROS:  A ROS was performed and pertinent positives and negatives are included.  Exam:  Vitals:   08/06/16 1121  Weight: 159 lb (72.1 kg)  Height: 5' 3.75" (1.619 m)   Body mass index is 27.51 kg/m.   General appearance:  Normal Thyroid:  Symmetrical, normal in size, without palpable masses or nodularity. Respiratory  Auscultation:  Clear without wheezing or rhonchi Cardiovascular  Auscultation:  Regular rate, without rubs, murmurs or gallops  Edema/varicosities:  Not grossly evident               Left leg varicosities posterior thigh, nonthrombosed, good circulation to leg Abdominal  Soft,nontender, without masses, guarding or rebound.  Liver/spleen:  No organomegaly noted  Hernia:  None appreciated  Skin  Inspection:  Grossly normal  melasma   Breasts: Examined lying and sitting.     Right: Without masses, retractions, discharge or axillary adenopathy.     Left: Without masses, retractions, discharge or axillary adenopathy. Gentitourinary   Inguinal/mons:  Normal without inguinal adenopathy  External genitalia:  Normal  BUS/Urethra/Skene's glands:  Normal  Vagina:  Normal +1 cystocele  Cervix:   Normal IUD strings visible  Uterus:   normal in size, shape and contour.  Midline and mobile  Adnexa/parametria:     Rt: Without masses or tenderness.   Lt: Without masses or tenderness.  Anus and perineum: Normal  Digital rectal exam: Normal sphincter tone without palpated masses or tenderness  Assessment/Plan:  51 y.o. MHF G3 P3 for annual exam with numerous complaints.  Perimenopausal with numerous menopausal symptoms 2013 [place] IUD +1 cystocele/stress incontinence  Aching varicose veins without edema  Plan: Long discussion about menopause, many of the symptoms experiencing related to menopause. Options reviewed, would like to try HRT, estradiol patch 0.05 weekly prescription, proper use given and reviewed proper administration, prepped Prometrium 200 mg at bedtime day 1 through 12 of each month at bedtime. Reviewed may get some spotting after Prometrium, instructed to call if heavy bleeding. Instructed to call if no relief of her symptoms. Vein center information given.  2. SBE's, annual screening mammogram, breast center information given instructed to schedule. Encouraged most days of the week 30 minute exercise, calcium rich diet, vitamin D 2000 daily encouraged.  3. Encouraged to empty bladder more frequently, avoid caffeinated carbonated beverages.  4. CBC, lipid panel, CMP, TSH, UA, Pap with HR HPV typing new screening guidelines reviewed.    [place], 12:00 PM 08/06/2016

## 2016-08-06 NOTE — Patient Instructions (Signed)
La menopausia (Menopause) La menopausia es el perodo normal de la vida en el cual los perodos menstruales cesan por completo. Se completa cuando no se tienen 12 perodos menstruales consecutivos. Ocurre generalmente en mujeres entre los 40 y los 55 aos. Muy rara vez una mujer tiene la menopausia antes de los 40 aos. En la menopausia, los ovarios dejan de producir las hormonas femeninas estrgeno y progesterona. Esto puede causar sntomas indeseables y tambin afectar a la salud. A veces los sntomas aparecen entre 4 y 5 aos antes de que comience la menopausia. No existe una relacin entre la menopausia y:  Los anticonceptivos orales.  La cantidad de hijos que tuvo.  La raza.  La edad en que comenzaron los perodos menstruales (menarca). Las mujeres que fuman mucho y las que son muy delgadas pueden desarrollar la menopausia a una edad ms temprana. CAUSAS  Los ovarios dejan de producir las hormonas femeninas estrgeno y progesterona.  Otras causas son: ? Ciruga en la que se extirpan ambos ovarios. ? Los ovarios que dejan de funcionar sin un motivo conocido. ? Tumores de la glndula pituitaria en el cerebro. ? Enfermedades que afectan los ovarios y la produccin de hormonas. ? Radioterapia en el abdomen o la pelvis. ? Quimioterapia que afecte a los ovarios.  SNTOMAS  Acaloramiento.  Sudoracin nocturna.  Disminucin del deseo sexual.  Sequedad vaginal y adelgazamiento de la vagina, lo que causa relaciones sexuales dolorosas.  Sequedad de la piel y aparicin de arrugas.  Dolores de cabeza.  Cansancio.  Irritabilidad.  Problemas de memoria.  Aumento de peso.  Infeccin de la vejiga.  Aparicin de vello en el rostro y el pecho.  Infertilidad. Los sntomas ms graves pueden incluir:  Prdida de masa sea osteoporosis) que favorece la rotura de huesos(fracturas).  Depresin.  Endurecimiento y estrechamiento de las arterias (aterosclerosis) lo que puede causar  infarto e ictus. DIAGNSTICO  El perodo menstrual no aparece por 12 meses seguidos.  Examen fsico.  Estudios hormonales de la sangre.  TRATAMIENTO Hay muchas opciones de tratamiento y casi tantas dudas como opciones existen. La decisin de tratar o no los cambios que trae la menopausia es una decisin que realiza el profesional de acuerdo con cada persona en particular. El profesional comentar las opciones de tratamiento con usted. Juntos podrn decidir qu tratamiento es el mejor para usted. Las opciones de tratamiento pueden incluir:  Terapia hormonal (estrgenos y progesterona).  Medicamentos no hormonales.  Tratamiento de los sntomas individuales con medicamentos (por ejemplo antidepresivos para la depresin).  Algunos medicamentos herbales pueden ayudar en sntomas especficos.  Psicoterapia con un psiclogo o psiquiatra.  Terapia grupal.  Cambios en el estilo de vida, como: ? Consumir una dieta saludable. ? Actividad fsica regular. ? Limitar el consumo de cafena y alcohol. ? Control del estrs y meditacin.  No realizar tratamiento. INSTRUCCIONES PARA EL CUIDADO EN EL HOGAR  Tome todos los medicamentos como el mdico le indique.  Descanse y duerma lo suficiente.  Haga ejercicios regularmente.  Consuma una dieta que contenga calcio (bueno para los huesos) y productos derivados de la soja (actan como estrgenos).  Evite las bebidas alcohlicas.  No fume.  Si tiene sofocones, vstase en capas.  Tome suplementos de calcio y vitamina D para fortalecer los huesos.  Puede utilizar lubricantes y humectantes de venta libre para la sequedad vaginal.  En algunos casos la terapia de grupo podr ayudarla.  La acupuntura puede ser de ayuda en ciertos casos.  SOLICITE ATENCIN MDICA SI:    No est segura de estar en la menopausia.  Tiene sntomas de menopausia y necesita asesoramiento y tratamiento.  Tiene 55 aos y an tiene perodos menstruales.  Tiene  dolor durante las relaciones sexuales.  La menopausia se ha completado (no ha tenido el perodo menstrual durante 12 meses) y tiene un sangrado vaginal.  Necesita ser derivada a un especialista (gineclogo, psiquiatra, o psiclogo) para realizar un tratamiento.  SOLICITE ATENCIN MDICA DE INMEDIATO SI:  Siente una depresin intensa e incontrolable.  Tiene una hemorragia vaginal abundante.  Siente y cree que se ha fracturado un hueso.  Siente dolor al orinar.  Siente dolor en el pecho o en la pierna.  Tiene latidos cardacos rpidos (palpitaciones).  Siente dolor de cabeza intenso.  Tiene problemas de visin.  Siente un bulto en la mama.  Siente un dolor abdominal intenso o indigestin.  Esta informacin no tiene como fin reemplazar el consejo del mdico. Asegrese de hacerle al mdico cualquier pregunta que tenga. Document Released: 08/09/2006 Document Revised: 02/28/2013 Document Reviewed: 01/25/2013 Elsevier Interactive Patient Education  2017 [employer]vier Inc.  

## 2016-08-07 LAB — URINALYSIS W MICROSCOPIC + REFLEX CULTURE
BACTERIA UA: NONE SEEN [HPF]
Bilirubin Urine: NEGATIVE
CRYSTALS: NONE SEEN [HPF]
Casts: NONE SEEN [LPF]
Glucose, UA: NEGATIVE
Ketones, ur: NEGATIVE
Leukocytes, UA: NEGATIVE
Nitrite: NEGATIVE
PROTEIN: NEGATIVE
SPECIFIC GRAVITY, URINE: 1.019 (ref 1.001–1.035)
YEAST: NONE SEEN [HPF]
pH: 6 (ref 5.0–8.0)

## 2016-08-08 LAB — URINE CULTURE: ORGANISM ID, BACTERIA: NO GROWTH

## 2016-08-09 ENCOUNTER — Other Ambulatory Visit: Payer: BLUE CROSS/BLUE SHIELD

## 2016-08-09 DIAGNOSIS — Z01419 Encounter for gynecological examination (general) (routine) without abnormal findings: Secondary | ICD-10-CM

## 2016-08-09 DIAGNOSIS — Z13220 Encounter for screening for lipoid disorders: Secondary | ICD-10-CM

## 2016-08-09 DIAGNOSIS — Z1329 Encounter for screening for other suspected endocrine disorder: Secondary | ICD-10-CM

## 2016-08-09 LAB — CBC WITH DIFFERENTIAL/PLATELET
BASOS ABS: 0 {cells}/uL (ref 0–200)
Basophils Relative: 0 %
EOS PCT: 2 %
Eosinophils Absolute: 118 cells/uL (ref 15–500)
HCT: 35.5 % (ref 35.0–45.0)
Hemoglobin: 11.7 g/dL (ref 11.7–15.5)
LYMPHS ABS: 2006 {cells}/uL (ref 850–3900)
Lymphocytes Relative: 34 %
MCH: 28.7 pg (ref 27.0–33.0)
MCHC: 33 g/dL (ref 32.0–36.0)
MCV: 87.2 fL (ref 80.0–100.0)
MONOS PCT: 5 %
MPV: 9.5 fL (ref 7.5–12.5)
Monocytes Absolute: 295 cells/uL (ref 200–950)
NEUTROS ABS: 3481 {cells}/uL (ref 1500–7800)
NEUTROS PCT: 59 %
PLATELETS: 274 10*3/uL (ref 140–400)
RBC: 4.07 MIL/uL (ref 3.80–5.10)
RDW: 14 % (ref 11.0–15.0)
WBC: 5.9 10*3/uL (ref 3.8–10.8)

## 2016-08-09 LAB — COMPREHENSIVE METABOLIC PANEL
ALT: 14 U/L (ref 6–29)
AST: 11 U/L (ref 10–35)
Albumin: 3.9 g/dL (ref 3.6–5.1)
Alkaline Phosphatase: 66 U/L (ref 33–115)
BILIRUBIN TOTAL: 0.4 mg/dL (ref 0.2–1.2)
BUN: 13 mg/dL (ref 7–25)
CHLORIDE: 105 mmol/L (ref 98–110)
CO2: 28 mmol/L (ref 20–31)
CREATININE: 0.55 mg/dL (ref 0.50–1.10)
Calcium: 8.9 mg/dL (ref 8.6–10.2)
Glucose, Bld: 92 mg/dL (ref 65–99)
Potassium: 3.8 mmol/L (ref 3.5–5.3)
SODIUM: 140 mmol/L (ref 135–146)
Total Protein: 7.1 g/dL (ref 6.1–8.1)

## 2016-08-09 LAB — LIPID PANEL
CHOLESTEROL: 172 mg/dL (ref <200)
HDL: 34 mg/dL — ABNORMAL LOW (ref >50)
LDL Cholesterol: 116 mg/dL — ABNORMAL HIGH (ref <100)
Total CHOL/HDL Ratio: 5.1 Ratio — ABNORMAL HIGH (ref <5.0)
Triglycerides: 108 mg/dL (ref <150)
VLDL: 22 mg/dL (ref <30)

## 2016-08-09 LAB — TSH: TSH: 1.9 mIU/L

## 2016-08-10 LAB — PAP, TP IMAGING W/ HPV RNA, RFLX HPV TYPE 16,18/45: HPV mRNA, High Risk: NOT DETECTED

## 2016-08-13 ENCOUNTER — Ambulatory Visit: Payer: BLUE CROSS/BLUE SHIELD | Admitting: Podiatry

## 2016-08-19 ENCOUNTER — Other Ambulatory Visit: Payer: Self-pay

## 2016-08-19 DIAGNOSIS — I8393 Asymptomatic varicose veins of bilateral lower extremities: Secondary | ICD-10-CM

## 2016-08-23 ENCOUNTER — Ambulatory Visit: Payer: BLUE CROSS/BLUE SHIELD | Admitting: Podiatry

## 2016-08-30 ENCOUNTER — Telehealth: Payer: Self-pay | Admitting: *Deleted

## 2016-08-30 NOTE — Telephone Encounter (Signed)
Ok, estradiol .5 mg daily and provera 10mg  day 1-2 of each month.  Are both too expensive, they are both generic.

## 2016-08-30 NOTE — Telephone Encounter (Signed)
Pt walked in stating her for climara patch 0.05 mg and progesterone 200 mg is too expensive, asked if another medication could be prescribed? Please advise

## 2016-08-31 ENCOUNTER — Ambulatory Visit
Admission: RE | Admit: 2016-08-31 | Discharge: 2016-08-31 | Disposition: A | Discharge status: Home / Self Care | Payer: BLUE CROSS/BLUE SHIELD | Source: Ambulatory Visit | Attending: Women's Health | Admitting: Women's Health

## 2016-08-31 DIAGNOSIS — Z1231 Encounter for screening mammogram for malignant neoplasm of breast: Secondary | ICD-10-CM

## 2016-08-31 MED ORDER — ESTRADIOL 0.5 MG PO TABS: 0.5000 mg | ORAL_TABLET | Freq: Every day | ORAL | 11 refills | Status: DC

## 2016-08-31 MED ORDER — MEDROXYPROGESTERONE ACETATE 10 MG PO TABS: ORAL_TABLET | ORAL | 11 refills | Status: DC

## 2016-08-31 NOTE — Telephone Encounter (Signed)
Rx sent to pharmacy, claudia will relay to patient.

## 2016-09-22 ENCOUNTER — Encounter: Payer: Self-pay | Admitting: Vascular Surgery

## 2016-10-05 ENCOUNTER — Encounter: Payer: Self-pay | Admitting: Vascular Surgery

## 2016-10-05 ENCOUNTER — Ambulatory Visit (INDEPENDENT_AMBULATORY_CARE_PROVIDER_SITE_OTHER): Payer: BLUE CROSS/BLUE SHIELD | Admitting: Vascular Surgery

## 2016-10-05 ENCOUNTER — Ambulatory Visit (HOSPITAL_COMMUNITY)
Admission: RE | Admit: 2016-10-05 | Discharge: 2016-10-05 | Disposition: A | Discharge status: Home / Self Care | Payer: BLUE CROSS/BLUE SHIELD | Source: Ambulatory Visit | Attending: Vascular Surgery | Admitting: Vascular Surgery

## 2016-10-05 VITALS — BP 122/69 | HR 72 | Temp 97.6°F | Resp 16 | Ht 64.0 in | Wt 158.0 lb

## 2016-10-05 DIAGNOSIS — I8393 Asymptomatic varicose veins of bilateral lower extremities: Secondary | ICD-10-CM | POA: Diagnosis not present

## 2016-10-05 DIAGNOSIS — I83893 Varicose veins of bilateral lower extremities with other complications: Secondary | ICD-10-CM | POA: Insufficient documentation

## 2016-10-05 DIAGNOSIS — I872 Venous insufficiency (chronic) (peripheral): Secondary | ICD-10-CM | POA: Insufficient documentation

## 2016-10-05 NOTE — Progress Notes (Signed)
Subjective:     Patient ID: Caitlin Dunlap, female   DOB: 03/25/1966, 51 y.o.   MRN: [id number]  HPI This 51 year old female is self referred for painful varicose veins. She describes aching and itching discomfort in both thigh and calf areas the more she stands. She works as a Theme park manager. She has no history of DVT, phlebitis stasis ulcers or bleeding. She does not speak much English and is accompanied by a family member who does speak [place] and translates for her. Patient occasionally will wear elastic compression stockings which decrease her symptoms.  Past Medical History:  Diagnosis Date  . Anemia   . Hashimoto's thyroiditis   . Migraine   . Osteoarthritis    as a child    Social History  Substance Use Topics  . Smoking status: Never Smoker  . Smokeless tobacco: Never Used  . Alcohol use No    Family History  Problem Relation Age of Onset  . Diabetes Maternal Aunt     No Known Allergies   Current Outpatient Prescriptions:  .  doxylamine, Sleep, (UNISOM) 25 MG tablet, Take 25 mg by mouth at bedtime as needed., Disp: , Rfl:  .  estradiol (ESTRACE) 0.5 MG tablet, Take 1 tablet (0.5 mg total) by mouth daily., Disp: 30 tablet, Rfl: 11 .  ibuprofen (ADVIL,MOTRIN) 200 MG tablet, Take 200 mg by mouth every 6 (six) hours as needed., Disp: , Rfl:  .  medroxyPROGESTERone (PROVERA) 10 MG tablet, Take one tablet by mouth daily day 1-12 of each month, Disp: 12 tablet, Rfl: 11 .  Omega-3 Fatty Acids (OMEGA 3 PO), Take 1 tablet by mouth daily., Disp: , Rfl:   Vitals:   10/05/16 1255  BP: 122/69  Pulse: 72  Resp: 16  Temp: 97.6 F (36.4 C)  SpO2: 100%  Weight: 158 lb (71.7 kg)  Height: 5\' 4"  (1.626 m)    Body mass index is 27.12 kg/m.         Review of Systems Denies chest pain, dyspnea on exertion, PND, orthopnea, hemoptysis    Objective:   Physical Exam BP 122/69 (BP Location: Left Arm, Patient Position: Sitting, Cuff Size: Large)   Pulse 72   Temp 97.6 F (36.4  C)   Resp 16   Ht 5\' 4"  (1.626 m)   Wt 158 lb (71.7 kg)   SpO2 100%   BMI 27.12 kg/m     Gen.-alert and oriented x3 in no apparent distress HEENT normal for age Lungs no rhonchi or wheezing Cardiovascular regular rhythm no murmurs carotid pulses 3+ palpable no bruits audible Abdomen soft nontender no palpable masses Musculoskeletal free of  major deformities Skin clear -no rashes Neurologic normal Lower extremities 3+ femoral and dorsalis pedis pulses palpable bilaterally with no edema Bilateral patch of spider veins in distal thigh extending down in the popliteal area and lateral calf fairly symmetrical. Scattered spider veins anteriorly on the thighs. No hyperpigmentation distally and no ulceration. No edema noted. No bulging varicosities noted  Today I ordered a venous duplex exam chart reviewed and interpreted. There is no DVT. The great saphenous veins are normal size with no evidence of significant reflux.      Assessment:     Bilateral spider veins Bilateral leg aching and itching-etiology unknown    Plan:     Have discussed with patient possible foam sclerotherapy and also the fact that this may not completely eliminate her symptoms and also that spider veins may not be completely eliminated Caitlin Dunlap further  discuss this with the patient and she will make a decision whether she would like to proceed with this No indication for further evaluation or intervention other than sclerotherapy

## 2016-10-05 NOTE — Progress Notes (Signed)
Subjective:     Patient ID: Caitlin Dunlap, female   DOB: Mar 05, 1966, 51 y.o.   MRN: [id number]  HPI  Review of Systems     Objective:   Physical Exam     Assessment:         Plan:

## 2016-11-03 ENCOUNTER — Ambulatory Visit (INDEPENDENT_AMBULATORY_CARE_PROVIDER_SITE_OTHER): Payer: BLUE CROSS/BLUE SHIELD | Admitting: Women's Health

## 2016-11-03 ENCOUNTER — Encounter: Payer: Self-pay | Admitting: Women's Health

## 2016-11-03 ENCOUNTER — Encounter: Payer: Self-pay | Admitting: *Deleted

## 2016-11-03 VITALS — BP 118/76

## 2016-11-03 DIAGNOSIS — N760 Acute vaginitis: Secondary | ICD-10-CM | POA: Diagnosis not present

## 2016-11-03 DIAGNOSIS — M7918 Myalgia, other site: Secondary | ICD-10-CM

## 2016-11-03 DIAGNOSIS — N3001 Acute cystitis with hematuria: Secondary | ICD-10-CM

## 2016-11-03 DIAGNOSIS — B9689 Other specified bacterial agents as the cause of diseases classified elsewhere: Secondary | ICD-10-CM

## 2016-11-03 DIAGNOSIS — B3731 Acute candidiasis of vulva and vagina: Secondary | ICD-10-CM

## 2016-11-03 DIAGNOSIS — M791 Myalgia: Secondary | ICD-10-CM

## 2016-11-03 DIAGNOSIS — B373 Candidiasis of vulva and vagina: Secondary | ICD-10-CM | POA: Diagnosis not present

## 2016-11-03 LAB — URINALYSIS W MICROSCOPIC + REFLEX CULTURE
BILIRUBIN URINE: NEGATIVE
CASTS: NONE SEEN [LPF]
CRYSTALS: NONE SEEN [HPF]
Glucose, UA: NEGATIVE
Ketones, ur: NEGATIVE
Nitrite: NEGATIVE
PROTEIN: NEGATIVE
Specific Gravity, Urine: 1.01 (ref 1.001–1.035)
pH: 7.5 (ref 5.0–8.0)

## 2016-11-03 MED ORDER — FLUCONAZOLE 150 MG PO TABS: 150.0000 mg | ORAL_TABLET | Freq: Once | ORAL | 1 refills | Status: AC

## 2016-11-03 MED ORDER — CYCLOBENZAPRINE HCL 5 MG PO TABS: 5.0000 mg | ORAL_TABLET | Freq: Every day | ORAL | 0 refills | Status: DC

## 2016-11-03 MED ORDER — METRONIDAZOLE 500 MG PO TABS: 500.0000 mg | ORAL_TABLET | Freq: Two times a day (BID) | ORAL | 0 refills | Status: DC

## 2016-11-03 MED ORDER — SULFAMETHOXAZOLE-TRIMETHOPRIM 800-160 MG PO TABS: 1.0000 | ORAL_TABLET | Freq: Two times a day (BID) | ORAL | 0 refills | Status: DC

## 2016-11-03 NOTE — Progress Notes (Signed)
Presents with complaint of upper right back pain for the past year. Some relief with Motrin, heat/ cold treatments. No limited mobility, Denies injury, change in routine or exercise. States back discomfort is greater in the morning  discomfort decreases during the day. Denies pain or burning with urination, vaginal discharge, itching or odor, abdominal pain or fever. ParaGard IUD with amenorrhea started cyclic HRT in January and is feeling much better. No bleeding. Mild language barrier. History of thyroid problems.  Exam: Appears well. Able to move without difficulty, handgrip strong bilaterally, walks without difficulty or limitations. No CVAT. Pain appears to be more muscular in nature. Abdomen soft without tenderness or rebound. UA: +2 blood, +3 leukocytes, TNTC WBCs, 40-60 RBCs, 20-40 squamous epithelials, many bacteria, many yeast, clue cells present.  UTI with hematuria Yeast vaginitis Bacteria vaginosis Back strain  Plan: Septra twice daily for 3 days #6, Flagyl 500 twice daily for 7 days, alcohol precautions reviewed, Diflucan 150 by mouth 1 dose. Reviewed meds in both [place] and [place] with [place]. Flexeril 5 mg at bedtime when necessary for back pain. Prescription given. UTI prevention discussed. Instructed to call if no relief of symptoms.

## 2016-11-03 NOTE — Patient Instructions (Signed)
Vaginosis bacteriana (Bacterial Vaginosis) La vaginosis bacteriana es una infeccin de la vagina. Se produce cuando crece una cantidad excesiva de grmenes normales (bacterias sanas) en la vagina. Esta infeccin aumenta el riesgo de contraer otras infecciones de transmisin sexual. El tratamiento de esta infeccin puede ayudar a reducir el riesgo de otras infecciones, como:  Clamidia.  Roderick Pee.  VIH.  Herpes. [place] los medicamentos tal como se lo indic su mdico.  Finalice la prescripcin completa, aunque comience a sentirse mejor.  Comunique a sus compaeros sexuales que sufre una infeccin. Deben consultar a su mdico para iniciar un tratamiento.  Durante el tratamiento:  Teacher, music o use preservativos de Cabin crew.  No se haga duchas vaginales.  No consuma alcohol a menos que el mdico lo autorice.  No amamante a menos que el mdico la autorice. SOLICITE AYUDA SI:  No mejora luego de 3 das de tratamiento.  Observa una secrecin (prdida) de color gris ms abundante que proviene de la vagina.  Siente ms dolor que antes.  Tiene fiebre. ASEGRESE DE QUE:  Comprende estas instrucciones.  Controlar su afeccin.  Recibir ayuda de inmediato si no mejora o si empeora. Esta informacin no tiene Marine scientist el consejo del mdico. Asegrese de hacerle al mdico cualquier pregunta que tenga. Document Released: 09/24/2008 Document Revised: 10/20/2015 Document Reviewed: 02/07/2013 Elsevier Interactive Patient Education  2017 [place] vas urinarias en los adultos (Urinary Tract Infection, Adult) Una infeccin urinaria (IU) es una infeccin en cualquier parte de las vas urinarias, que incluyen los riones, los urteres, la vejiga y Geologist, engineering. Estos rganos fabrican, Buyer, retail y eliminan la orina del organismo. La IU puede ser una infeccin de la vejiga (cistitis) o infeccin de los riones  (pielonefritis). CAUSAS Esta infeccin puede ser causada por hongos, virus o bacterias. Las bacterias son las causas ms comunes de las IU. Esta afeccin tambin puede ser provocada por no vaciar la vejiga por completo durante la miccin en repetidas ocasiones. FACTORES DE RIESGO Es ms probable que esta afeccin se manifieste si:  Usted ignora la necesidad de Garment/textile technologist o retiene la orina durante largos perodos.  No vaca la vejiga completamente durante la miccin.  Se limpia de atrs hacia adelante despus de orinar o defecar, en el caso de que sea [place].  Est circuncidado, en el caso de que sea varn.  Tiene estreimiento.  Tiene colocada una sonda urinaria permanente.  Tiene debilitado el sistema de defensa (inmunitario) del cuerpo.  Tiene una enfermedad que [employer] intestinos, los riones o la vejiga.  Tiene diabetes.  Toma antibiticos con frecuencia o durante largos perodos, y los antibiticos ya no resultan eficaces para combatir algunos tipos de infecciones (resistencia a los antibiticos).  Toma medicamentos que [employer] vas [place].  Est expuesto a sustancias qumicas que le irritan las vas urinarias.  Es mujer. SNTOMAS Los sntomas de esta afeccin incluyen lo siguiente:  Cristy Hilts.  Miccin frecuente o eliminacin de pequeas cantidades de orina con frecuencia.  Necesidad urgente de Garment/textile technologist.  Ardor o dolor al [employer].  Orina con mal olor u olor atpico.  Bennie Hind turbia.  Dolor en la parte baja del abdomen o en la espalda.  Dificultad para orinar.  Sangre en la orina.  Vmitos o ms apetito de lo normal.  Diarrea o dolor abdominal.  Secrecin vaginal, si es mujer. DIAGNSTICO Esta afeccin se diagnostica mediante sus antecedentes mdicos y un examen fsico. Tambin deber proporcionar  una muestra de orina para realizar anlisis. Podrn indicarle otros estudios, por ejemplo:  Anlisis de [place].  Pruebas de deteccin de enfermedades de  transmisin sexual (ETS). Si ha tenido ms de una IU, se pueden hacer estudios de diagnstico por imgenes o una citoscopia para determinar la causa de las infecciones. TRATAMIENTO El tratamiento de esta afeccin suele incluir una combinacin de dos o ms de los siguientes:  Antibiticos.  Otros medicamentos para tratar las causas menos frecuentes de infeccin urinaria.  Medicamentos de venta libre para Best boy.  Consumo de la cantidad necesaria de agua para mantenerse hidratado. [place] los medicamentos de venta libre y los recetados solamente como se lo haya indicado el mdico.  Si le recetaron un antibitico, tmelo como se lo haya indicado el mdico. No deje de tomar los antibiticos aunque comience a Sports administrator.  Evite el alcohol, la cafena, el t y las bebidas gaseosas. Estas sustancias pueden irritar la vejiga.  Beba suficiente lquido para Consulting civil engineer orina clara o de color amarillo plido.  Concurra a todas las visitas de control como se lo haya indicado el mdico. Esto es importante.  Asegrese de lo siguiente:  Vaciar la vejiga con frecuencia y en su totalidad. No contener la orina durante largos perodos.  Vaciar la vejiga antes y despus de Clinical biochemist.  Limpiar de adelante hacia atrs despus de defecar, si es mujer. Usar cada trozo de papel una vez cuando se limpie. SOLICITE ATENCIN MDICA SI:  Siente dolor en la espalda.  Tiene fiebre.  Siente nuseas o vomita.  Los sntomas no mejoran despus de 3das de tratamiento.  Los sntomas desaparecen y Teacher, adult education. SOLICITE ATENCIN MDICA DE INMEDIATO SI:  Siente dolor intenso en la espalda o en la zona inferior del abdomen.  Tiene vmitos y no puede tragar medicamentos ni agua. Esta informacin no tiene Marine scientist el consejo del mdico. Asegrese de hacerle al mdico cualquier pregunta que tenga. Document Released: 04/07/2005  Document Revised: 10/20/2015 Document Reviewed: 05/19/2015 Elsevier Interactive Patient Education  2017 [employer].

## 2016-11-04 LAB — URINE CULTURE

## 2016-11-10 ENCOUNTER — Ambulatory Visit (INDEPENDENT_AMBULATORY_CARE_PROVIDER_SITE_OTHER): Payer: Self-pay | Admitting: *Deleted

## 2016-11-10 DIAGNOSIS — I8393 Asymptomatic varicose veins of bilateral lower extremities: Secondary | ICD-10-CM

## 2016-11-10 DIAGNOSIS — I868 Varicose veins of other specified sites: Secondary | ICD-10-CM

## 2016-11-10 NOTE — Progress Notes (Signed)
X=.3% Sotradecol administered with a 27g butterfly.  Patient received a total of 6cc.  Treated as much as possible with one syringe. Easy access. Tol well. Anticipate good results. Follow prn.  Photos: Yes.    Compression stockings applied: Yes.

## 2016-11-15 ENCOUNTER — Encounter: Payer: Self-pay | Admitting: Vascular Surgery

## 2016-11-24 ENCOUNTER — Encounter: Payer: Self-pay | Admitting: Gynecology

## 2017-03-09 ENCOUNTER — Encounter: Payer: Self-pay | Admitting: *Deleted

## 2017-03-16 ENCOUNTER — Ambulatory Visit: Payer: BLUE CROSS/BLUE SHIELD

## 2017-03-17 ENCOUNTER — Ambulatory Visit: Payer: BLUE CROSS/BLUE SHIELD | Admitting: *Deleted

## 2017-04-18 ENCOUNTER — Ambulatory Visit: Payer: BLUE CROSS/BLUE SHIELD | Admitting: Women's Health

## 2017-05-02 ENCOUNTER — Encounter: Payer: Self-pay | Admitting: Women's Health

## 2017-05-02 ENCOUNTER — Ambulatory Visit: Payer: BLUE CROSS/BLUE SHIELD | Admitting: Women's Health

## 2017-05-02 ENCOUNTER — Telehealth: Payer: Self-pay | Admitting: *Deleted

## 2017-05-02 VITALS — BP 124/78

## 2017-05-02 DIAGNOSIS — G43001 Migraine without aura, not intractable, with status migrainosus: Secondary | ICD-10-CM

## 2017-05-02 DIAGNOSIS — Z79890 Hormone replacement therapy (postmenopausal): Secondary | ICD-10-CM

## 2017-05-02 DIAGNOSIS — N644 Mastodynia: Secondary | ICD-10-CM

## 2017-05-02 MED ORDER — MEDROXYPROGESTERONE ACETATE 10 MG PO TABS: ORAL_TABLET | ORAL | 4 refills | Status: DC

## 2017-05-02 MED ORDER — SUMATRIPTAN SUCCINATE 50 MG PO TABS: 50.0000 mg | ORAL_TABLET | ORAL | 0 refills | Status: DC | PRN

## 2017-05-02 NOTE — Telephone Encounter (Signed)
Needs appt for headache eval, daily to every 3 days mig with out aura Afternoon best, spanish speaking

## 2017-05-02 NOTE — Telephone Encounter (Signed)
1. Orders placed at [facility] center, pt scheduled on 05/04/17 2:20pm, will informed.   2. Referral placed in epic at [place] [facility] they will contact pt to schedule. Pt also aware they will call her to schedule.

## 2017-05-02 NOTE — Patient Instructions (Signed)
Dolor de cabeza general sin causa (General Headache Without Cause) El dolor de cabeza es un dolor o malestar que se siente en la zona de la cabeza o del cuello. Puede no tener una causa especfica. Hay muchas causas y tipos de dolores de [place]. Los dolores de cabeza ms comunes son los siguientes:  Cefalea tensional.  Cefaleas migraosas.  Cefalea en brotes.  Cefaleas diarias crnicas. INSTRUCCIONES PARA EL CUIDADO EN EL HOGAR Controle su afeccin para ver si hay cambios. Siga estos pasos para Aeronautical engineer afeccin: Control del [employer] medicamentos de venta libre y los recetados solamente como se lo haya indicado el mdico.  Cuando sienta dolor de cabeza acustese en un cuarto oscuro y tranquilo.  Si se lo indican, aplique hielo sobre la cabeza y la zona del cuello: ? Ponga el hielo en una bolsa plstica. ? Coloque una [employer] piel y la bolsa de hielo. ? Coloque el hielo durante 39minutos, 2 a 3veces por da.  Utilice una almohadilla trmica o tome una ducha con agua caliente para aplicar calor en la cabeza y la zona del cuello como se lo haya indicado el [place] luces tenues si le [employer] luces brillantes o sus dolores de cabeza empeoran. Comida y bebida  Mantenga un horario para las comidas.  Limite el consumo de bebidas alcohlicas.  Consuma menos cantidad de cafena o deje de tomarla. Instrucciones generales  Concurra a todas las visitas de control como se lo haya indicado el mdico. Esto es importante.  Lleve un diario de los dolores de cabeza para Neurosurgeon qu factores pueden desencadenarlos. Por ejemplo, escriba los siguientes datos: ? Lo que usted come y bebe. ? Cunto tiempo duerme. ? Algn cambio en su dieta o en los medicamentos.  Pruebe algunas tcnicas de relajacin, como los [place].  Limite el estrs.  Sintese con la espalda recta y no tense los msculos.  No consuma productos que contengan tabaco, incluidos  cigarrillos, tabaco de Higher education careers adviser o cigarrillos electrnicos. Si necesita ayuda para dejar de fumar, consulte al mdico.  Haga actividad fsica habitualmente como se lo haya indicado el mdico.  Tenga un horario fijo para dormir. Duerma entre 7 y 9horas o la cantidad de horas que le haya recomendado el mdico. SOLICITE ATENCIN MDICA SI:  Los medicamentos no Dealer los sntomas.  Tiene un dolor de cabeza que es diferente del dolor de cabeza habitual.  Tiene nuseas o vmitos.  Tiene fiebre.  SOLICITE ATENCIN MDICA DE INMEDIATO SI:  El dolor se hace cada vez ms intenso.  Ha vomitado repetidas veces.  Presenta rigidez en el cuello.  Sufre prdida de la visin.  Tiene problemas para hablar.  Siente dolor en el ojo o en el odo.  Presenta debilidad muscular o prdida del control muscular.  Pierde el equilibrio o tiene problemas para Writer.  Sufre mareos o se desmaya.  Se siente confundido.  Esta informacin no tiene Marine scientist el consejo del mdico. Asegrese de hacerle al mdico cualquier pregunta que tenga. Document Released: 04/07/2005 Document Revised: 03/19/2015 Document Reviewed: 10/21/2014 Elsevier Interactive Patient Education  2017 [employer].

## 2017-05-02 NOTE — Progress Notes (Signed)
51 year old MHF G3P3 presents complaining of left sided breast pain x 2 weeks during her menstrual cycle and a little after. History of negative screening mammograms. Has ParaGard IUD (1700) and on cyclic progesterone/estrogen. Requests to reevaluate IUD's placement because her husband could feel something during intercourse and he cut the strings shorter which resolved the issue. Normal cycles lasting 1-3 days, HRT greatly improved her vasomotor symptoms. Has longstanding history of migraines with vomiting at least twice a week and requests referral to neurology because she has a friend who had similar symptoms who had a stroke.  Denies any vaginal bleeding/discharge/itching, fever, abdominal pain, nipple discharge, rash. Headache has no accompanying visual changes and not worse with lying down. Sexually active with husband. [employer]. HPI obtained from [place] interpreter who was present at visit.   Exam: Appears well, no acute distress. Melasma over bilateral cheeks. Breast exam: Sitting and lying position, Bilateral breasts exhibit no erythema, warmth, induration. No nipple retraction or discharge. No palpable masses. No axillary lymphadenopathy. Pelvic exam: External genitalia within normal limits. Speculum exam: Cervix is pink, ParaGard IUD strings visualized and in appropriate location. Vaginal mucosa normal.   Left-sided breast pain/probable fibrocystic Migraines without aura, chronic - 2+ episodes/week ParaGard IUD/cyclic hormonal therapy   Plan: Schedule lt sided diagnostic mammogram/US.  Reassured that no abnormalities found on physical exam. Reassured of proper placement of ParaGard IUD. Refill Estrace and Provera. Start Imitrex 50 mg for migraines, proper administration reviewed, no refills and referred to neurology per patient's request. Reassured that given extensive history of migraines with no new change in character or alarm symptoms, is not currently concerning for acute  pathology.

## 2017-05-02 NOTE — Telephone Encounter (Signed)
-----   Message from Huel Cote, NP sent at 05/02/2017 12:23 PM EDT ----- Needs diagnostic mammogram, left breast outer tenderness for 2 weeks, no palpable changes  Afternoon best  Spanish speaking

## 2017-05-04 ENCOUNTER — Inpatient Hospital Stay: Admission: RE | Admit: 2017-05-04 | Payer: Self-pay | Source: Ambulatory Visit

## 2017-05-04 ENCOUNTER — Other Ambulatory Visit: Payer: Self-pay

## 2017-05-12 NOTE — Telephone Encounter (Signed)
[facility] called and left message for pt to call

## 2017-05-20 ENCOUNTER — Other Ambulatory Visit: Payer: Self-pay

## 2017-05-20 ENCOUNTER — Inpatient Hospital Stay: Admission: RE | Admit: 2017-05-20 | Payer: Self-pay | Source: Ambulatory Visit

## 2017-05-23 ENCOUNTER — Ambulatory Visit
Admission: RE | Admit: 2017-05-23 | Discharge: 2017-05-23 | Disposition: A | Discharge status: Home / Self Care | Payer: BLUE CROSS/BLUE SHIELD | Source: Ambulatory Visit | Attending: Women's Health | Admitting: Women's Health

## 2017-05-23 DIAGNOSIS — N644 Mastodynia: Secondary | ICD-10-CM

## 2017-06-01 ENCOUNTER — Telehealth: Payer: Self-pay | Admitting: *Deleted

## 2017-06-01 MED ORDER — IBUPROFEN 600 MG PO TABS: 600.0000 mg | ORAL_TABLET | Freq: Three times a day (TID) | ORAL | 0 refills | Status: DC | PRN

## 2017-06-01 NOTE — Telephone Encounter (Signed)
Okay for Motrin 600 every 8 hours #60 no refills. Inform Motrin 200  is over-the-counter.

## 2017-06-01 NOTE — Telephone Encounter (Signed)
Patient informed by claudia, Rx sent.

## 2017-06-01 NOTE — Telephone Encounter (Signed)
Pt called requesting refill on motrin 200 mg tablet for back pain. Please advise

## 2017-06-07 ENCOUNTER — Other Ambulatory Visit: Payer: Self-pay | Admitting: Anesthesiology

## 2017-06-07 MED ORDER — ESTRADIOL 0.5 MG PO TABS: 0.5000 mg | ORAL_TABLET | Freq: Every day | ORAL | 0 refills | Status: DC

## 2017-07-19 ENCOUNTER — Other Ambulatory Visit: Payer: Self-pay | Admitting: *Deleted

## 2017-07-19 MED ORDER — ESTRADIOL 0.5 MG PO TABS: 0.5000 mg | ORAL_TABLET | Freq: Every day | ORAL | 1 refills | Status: DC

## 2017-07-19 NOTE — Telephone Encounter (Signed)
Pt annual scheduled on 08/31/17, needs refill on estrace 0.5 mg tablet. Rx sent.

## 2017-08-31 ENCOUNTER — Encounter: Payer: Self-pay | Admitting: Women's Health

## 2017-09-08 ENCOUNTER — Other Ambulatory Visit: Payer: Self-pay | Admitting: Women's Health

## 2017-09-08 DIAGNOSIS — Z79890 Hormone replacement therapy (postmenopausal): Secondary | ICD-10-CM

## 2017-09-08 NOTE — Telephone Encounter (Signed)
annual on 10/18/17

## 2017-09-28 ENCOUNTER — Ambulatory Visit (INDEPENDENT_AMBULATORY_CARE_PROVIDER_SITE_OTHER): Payer: BLUE CROSS/BLUE SHIELD | Admitting: Women's Health

## 2017-09-28 ENCOUNTER — Encounter: Payer: Self-pay | Admitting: Women's Health

## 2017-09-28 VITALS — BP 122/80 | Ht 64.0 in | Wt 171.0 lb

## 2017-09-28 DIAGNOSIS — Z79890 Hormone replacement therapy (postmenopausal): Secondary | ICD-10-CM

## 2017-09-28 DIAGNOSIS — N912 Amenorrhea, unspecified: Secondary | ICD-10-CM | POA: Diagnosis not present

## 2017-09-28 DIAGNOSIS — Z01419 Encounter for gynecological examination (general) (routine) without abnormal findings: Secondary | ICD-10-CM

## 2017-09-28 MED ORDER — ESTRADIOL 0.5 MG PO TABS: 0.5000 mg | ORAL_TABLET | Freq: Every day | ORAL | 4 refills | Status: DC

## 2017-09-28 MED ORDER — IBUPROFEN 600 MG PO TABS: 600.0000 mg | ORAL_TABLET | Freq: Three times a day (TID) | ORAL | 0 refills | Status: DC | PRN

## 2017-09-28 MED ORDER — MEDROXYPROGESTERONE ACETATE 10 MG PO TABS: ORAL_TABLET | ORAL | 4 refills | Status: DC

## 2017-09-28 NOTE — Progress Notes (Signed)
Caitlin Dunlap 09/28/1965 [id number]    History:    Presents for annual exam.  Interpreter present.  2013 ParaGard IUD, monthly cycles initially and then became amenorrheic 2018, after starting HRT has occasional 1-2 days of light bleeding.  Taking cyclic progesterone and daily estradiol with good relief of hot flashes and vaginal dryness.    Normal Pap and mammogram history.  05/2017 simple cyst noted on ultrasound annual is due now.  2014- colonoscopy.  Has had some problems with migraines and varicose veins in the past.  Has had long-term problems with right upper back discomfort that radiates around laterally.  No nausea, GI changes, fever or indigestion noted with discomfort mostly muscular.    Past medical history, past surgical history, family history and social history were all reviewed and documented in the EPIC chart.  3 children all doing well and  received Gardasil.  Reports parents are healthy, mother arthritis.  ROS:  A ROS was performed and pertinent positives and negatives are included.  Exam:  Vitals:   09/28/17 1449  BP: 122/80  Weight: 171 lb (77.6 kg)  Height: 5\' 4"  (1.626 m)   Body mass index is 29.35 kg/m.   General appearance:  Normal Thyroid:  Symmetrical, normal in size, without palpable masses or nodularity. Respiratory  Auscultation:  Clear without wheezing or rhonchi Cardiovascular  Auscultation:  Regular rate, without rubs, murmurs or gallops  Edema/varicosities:  Not grossly evident Abdominal  Soft,nontender, without masses, guarding or rebound.  Liver/spleen:  No organomegaly noted  Hernia:  None appreciated  Skin  Inspection:  Grossly normal   Breasts: Examined lying and sitting.     Right: Without masses, retractions, discharge or axillary adenopathy.     Left: Without masses, retractions, discharge or axillary adenopathy. Gentitourinary   Inguinal/mons:  Normal without inguinal adenopathy  External genitalia:  Normal  BUS/Urethra/Skene's  glands:  Normal  Vagina:  Normal  Cervix:  Normal  Uterus:   normal in size, shape and contour.  Midline and mobile  Adnexa/parametria:     Rt: Without masses or tenderness.   Lt: Without masses or tenderness.  Anus and perineum: Normal  Digital rectal exam: Normal sphincter tone without palpated masses or tenderness  Assessment/Plan:  52 y.o. M HF G3P3 for annual exam with no GYN complaints.  2013 ParaGard IUD on HRT Right upper back pain/muscular  Plan: HRT reviewed slight risk for blood clots, strokes and breast cancer.  Would like to continue states does feel better, estradiol 0.5 p.o. daily prescription, proper use given and reviewed, Provera 10 mg day 1 through 12 of each month, reviewed may still have a small withdrawal bleed after Provera.  SBE's, continue annual screening mammogram, due instructed to schedule.  Reviewed importance of increasing and continuing exercise, decreasing calories, limiting carbs/calories, has had a 10 pound weight gain in the past year.  Vitamin D 2000 daily encouraged.  Reviewed core strengthening exercise may help with back discomfort.  CBC, CMP, Pap normal with negative HR HPV 2018, new screening guidelines reviewed.  Will come fasting next year for lipid panel.   Huel Cote [facility], 4:03 PM 09/28/2017

## 2017-09-28 NOTE — Patient Instructions (Addendum)
Vit d 2000 iu daily   Yoga  Mid-Back Strain With Rehab Mid-Back Strain Rehab Consulte al mdico qu ejercicios son seguros para usted. Haga los ejercicios exactamente como se lo haya indicado el mdico y gradelos como se lo hayan indicado. Es normal sentir un leve estiramiento, tirn, rigidez o molestia cuando haga estos ejercicios, pero debe detenerse de inmediato si siento un dolor repentino o si el dolor empeora. No comience a hacer estos ejercicios hasta que se lo indique el mdico. EJERCICIOS DE [employer] Y AMPLITUD DE MOVIMIENTOS Este ejercicio calienta los msculos y las articulaciones, y mejora la movilidad y la flexibilidad de la espalda y los hombros. Adems, ayuda a Best boy. Ejercicio A: Estiramiento de pecho y columna 1. Acustese boca arriba sobre una superficie firme. 2. Use una toalla o una manta pequea para armar un rollo de 4 pulgadas (10 cm) de dimetro. 3. Coloque la toalla a lo largo, debajo de la parte media de la espalda de modo que quede debajo de la columna, pero no debajo de los omplatos. 4. Para aumentar el estiramiento, puede colocar las manos debajo de la cabeza y dejar que los codos caigan a los costados. 5. Mantenga esta posicin durante __________ segundos.  Repita el ejercicio __________ veces. Realice este ejercicio __________ veces al da. EJERCICIOS DE FORTALECIMIENTO Estos ejercicios fortalecen los msculos de la espalda y los omplatos, y les otorgan resistencia. La resistencia es la capacidad de usar los msculos durante un tiempo prolongado, incluso despus de que se cansen. Ejercicio B: Elevaciones alternadas de pierna y brazo 1. [place] manos y las rodillas sobre una superficie firme. Si se colocar sobre una superficie muy dura, puede usar un elemento acolchado para apoyar las rodillas, como una alfombrilla para ejercicios. 2. Alinee los brazos y las piernas. Las manos deben estar debajo de los hombros y las rodillas debajo de  la cadera. 3. Eleve la pierna izquierda hacia atrs. Al mismo tiempo, eleve el brazo derecho y Engineer, petroleum frente a usted. ? No eleve la pierna por encima de la cadera. ? No eleve el brazo por encima del hombro. ? Mantenga los msculos del abdomen y de la espalda contrados. ? Mantenga la cadera [employer] el suelo. ? No arquee la espalda. ? Mantenga el equilibrio con cuidado y no contenga la respiracin. 4. Mantenga esta posicin durante __________ segundos. 5. Lentamente regrese a la posicin inicial y repita el ejercicio con la pierna derecha y el brazo izquierdo.  Repita __________ veces. Realice este ejercicio __________ veces al da. Ejercicio C: Remar con los brazos extendidos (extensin de hombros) 1. Prese con los pies separados a la distancia de los hombros. 2. Ate una banda para ejercicios a un objeto estable que est frente a usted, de modo que la banda est por encima de la altura del hombro o en el mismo nivel. 3. Sostenga un extremo de la banda para ejercicios en cada mano. 4. Extienda los codos y [employer] manos a la altura de los hombros. 5. Camine hacia atrs, alejndose del extremo fijo de la banda para ejercicios, hasta que [place] se tense. 6. Junte los omplatos y baje las manos hacia los costados de los muslos. Detngase cuando las manos estn en la misma posicin en ambos costados. No deje que las manos vayan hacia atrs del cuerpo. 7. Mantenga esta posicin durante __________ segundos. 8. Vuelva lentamente a la posicin inicial.  Repita __________ veces. Realice este ejercicio __________ veces al da. Ejercicio D:  Rotacin externa con el hombro, en decbito prono 1. Acustese boca abajo en una cama firme de modo que el antebrazo izquierdo/derecho cuelgue sobre el borde de la cama y la parte superior del brazo quede sobre la cama, extendido lejos del cuerpo. ? El codo debe estar flexionado. ? La palma debe [employer]. 2. Si se lo indican, sostenga una pesa  de __________ con la mano. 3. Contraiga los omplatos hacia el centro de la espalda. No levante el hombro hacia la [place]. 4. Mantenga el codo flexionado en forma de "L" (5 grados) mientras mueve el antebrazo hacia el techo lentamente. Mueva el antebrazo hasta la altura de la cama en direccin a la cabeza. ? No mueva la parte superior del brazo. ? En el final del movimiento, la palma debe apuntar hacia el suelo. 5. Mantenga esta posicin durante __________ segundos. 6. Vuelva lentamente a la posicin inicial y relaje los msculos.  Repita __________ veces. Realice este ejercicio __________ veces al da. Ejercicio E: Retraccin escapular y rotacin externa, remo 1. Sintese en una silla estable que no tenga apoyabrazos o pngase de pie. 2. Ate una banda para ejercicios a un objeto estable que est frente a usted, de modo que la banda est a la altura del hombro. 3. Sostenga un extremo de la banda para ejercicios en cada mano. 4. Lleve los brazos extendidos adelante. 5. Camine hacia atrs, alejndose del extremo fijo de la banda para ejercicios, [employer] se tense. 6. Tire la [employer] Mientras hace esto, flexione los codos y Teachers Insurance and Annuity [employer], pero evite mover el resto del cuerpo. No levante los hombros [employer]. 7. Detngase cuando los codos estn a los lados o ligeramente detrs del cuerpo. 8. Mantenga esta posicin durante __________ segundos. 9. Extienda lentamente los brazos para regresar a la posicin inicial.  Repita __________ veces. Realice este ejercicio __________ veces al da. Theotis Barrio [employer] La Therapist, nutritional se refiere a los movimientos y a las posiciones del cuerpo mientras realiza las actividades diarias. La postura es una parte de la Therapist, nutritional. La buena postura y la Engineer, agricultural corporal saludable pueden ayudar a Theatre stage manager estrs en las articulaciones y los tejidos del cuerpo. La buena postura significa que la columna mantiene su  posicin natural de curvatura en forma de S (la columna est en una posicin neutral), los hombros Lucianne Lei un poco hacia atrs y la cabeza no se inclina hacia adelante. A continuacin, se incluyen pautas generales para mejorar la postura y Quarry manager en las actividades diarias. De pie  Al estar de pie, mantenga la columna en la posicin neutral y los pies separados al ancho de caderas, aproximadamente. Mantenga las rodillas ligeramente flexionadas. Las [place], los hombros y las caderas deben estar alineados.  Cuando realice una tarea en la que deba inclinarse hacia adelante y estar de pie en el mismo sitio durante mucho tiempo, coloque un pie en un objeto estable de 2 a 4 pulgadas (5 a 10 cm) de alto, como un taburete. Esto ayuda a que la columna mantenga una posicin neutral.  Sentado  Cuando est sentado, mantenga la columna en posicin neutral y deje los pies apoyados en el suelo. Use un apoyapis, si es necesario, y [employer] muslos paralelos al suelo. Evite redondear los hombros e inclinar la cabeza hacia adelante.  Cuando trabaje en un escritorio o con una computadora, el escritorio debe estar a una altura en la que las manos estn un poco  ms abajo que los codos. Deslice la silla debajo del escritorio, de modo de estar lo suficientemente cerca como para mantener una buena postura.  Cuando trabaje con una computadora, coloque el monitor a una altura que le permita mirar derecho hacia adelante, sin tener que inclinar la cabeza hacia adelante o hacia atrs.  Reposo Al descansar o estar acostado, evite las posiciones que le causen ms dolor.  Si siente dolor al hacer actividades que exigen sentarse, inclinarse, agacharse o ponerse en cuclillas (actividades basadas en la flexin), acustese en una posicin en la que el cuerpo no deba doblarse mucho. Por ejemplo, evite acurrucarse de costado con los brazos y las rodillas cerca del pecho (posicin fetal).  Si siente dolor con las  actividades que exigen estar de pie durante mucho tiempo o estirar los brazos (actividades basadas en la extensin), acustese con la columna en una posicin neutral y flexione ligeramente las rodillas. Pruebe con las siguientes posiciones: ? Acostarse de costado con una almohada entre las rodillas. ? Acostarse boca arriba con una almohada debajo de las rodillas.  Levantar objetos  Cuando tenga que levantar un objeto, mantenga los pies separados el ancho de los hombros y apriete los msculos abdominales.  Flexione las rodillas y la cadera, y mantenga la columna en posicin neutral. Es importante levantar utilizando la fuerza de las piernas, no de la espalda. No trabe las rodillas hacia afuera.  Siempre pida ayuda a otra persona para levantar objetos pesados o incmodos.  Esta informacin no tiene como fin reemplazar el consejo del mdico. Asegrese de hacerle al mdico cualquier pregunta que tenga. Document Released: 04/14/2006 Document Revised: 11/12/2014 Document Reviewed: 04/09/2015 Elsevier Interactive Patient Education  2018 Elsevier Inc.   Mantenimiento de la salud de las mujeres posmenopusicas (Health Maintenance for Postmenopausal Women) La menopausia es un proceso normal en el cual se pierde la capacidad reproductiva. Este proceso ocurre gradualmente a lo largo de un perodo de meses o aos, por lo general entre los 48 y los 55aos. La menopausia es completa cuando no se han tenido 12menstruaciones consecutivas. Es importante hablar con el mdico sobre algunas de las enfermedades ms comunes que afectan a las mujeres posmenopusicas, como la cardiopata coronaria, el cncer y la prdida de la masa sea (osteoporosis). Adoptar un estilo de vida saludable y recibir atencin preventiva pueden ayudar a promover la salud y el bienestar. Adems, estas medidas pueden reducir las probabilidades de desarrollar algunas de estas enfermedades frecuentes. QU DEBO SABER ACERCA DE LA  MENOPAUSIA? Durante le menopausia, puede tener una serie de sntomas, por ejemplo:  Calores repentinos moderados a graves.  Sudoracin nocturna.  Disminucin del deseo sexual.  Cambios en el estado de nimo.  Dolores de cabeza.  Cansancio.  Irritabilidad.  Problemas de memoria.  Insomnio. Tratar o no los cambios que ocurren en la menopausia es una decisin personal que se toma con el mdico. QU DEBO SABER SOBRE LOS TRATAMIENTOS DE REPOSICIN HORMONAL Y LOS SUPLEMENTOS? Los productos para la terapia hormonal son eficaces para tratar los sntomas que se asocian con la menopausia, como los calores repentinos y las sudoraciones nocturnas. La reposicin hormonal conlleva ciertos riesgos, especialmente a medida que una mujer envejece. Si est pensando en usar tratamientos con estrgeno o estrgeno con progesterona, analice los beneficios y los riesgos con el mdico. QU DEBO SABER SOBRE LA CARDIOPATA CORONARIA Y EL ICTUS? A medida que una persona envejece, aumenta la probabilidad de tener cardiopata coronaria, infarto de miocardio e ictus. Esto puede deberse, en parte,   a los cambios hormonales que atraviesa el cuerpo durante la menopausia. Estos cambios pueden afectar la forma en que el organismo procesa las [place], los triglicridos y el colesterol de su dieta. El infarto de miocardio y el ictus son emergencias mdicas. Hay muchas cosas que se pueden hacer para ayudar a prevenir la cardiopata coronaria y el ictus:  Debe controlar su presin arterial al menos cada uno o Alicia. La hipertensin arterial causa enfermedades cardacas y [place] el riesgo de ictus.  Si tiene entre 71 y 79aos, consulte al mdico si debe tomar aspirina para prevenir un infarto de miocardio o un ictus.  No consuma ningn producto que contenga tabaco, lo que incluye cigarrillos, tabaco de Higher education careers adviser o Psychologist, sport and exercise. Si necesita ayuda para dejar de fumar, consulte al [employer].  Es importante seguir  una dieta sana y Theatre manager un peso saludable. ? Asegrese de [employer] verduras, frutas, productos lcteos de bajo contenido de [place] y Advertising account planner. ? No consuma alimentos con alto contenido de grasas slidas, azcares agregados o sal (sodio).  Realice actividad fsica con regularidad. Esta es una de las cosas ms importantes que puede hacer por su salud. ? Intente realizar al menos 176mnutos de actividad fsica por semana. El tipo de ejercicio que realice debe aumentar la frecuencia cardaca y hacerla sudar. Esto se conoce como ejercicio de i[place] ? Intente hacer ejercicios de elongacin por lo menos dos veces por semana. Agrguelos al plan de ejercicio de intensidad moderada.  Conozca sus cifras. Pdale al mdico que le controle el colesterol y el nivel sanguneo de glucosa. Siga hacindose anlisis de sAmerican Electric Powerse lo haya indicado el mdico. QU DEBO SABER SOBRE LAS PRUEBAS DE DETECCIN DEL CNCER? Hay varios tipos de cncer. Tome las siguientes medidas para reducir el riesgo y dActuaryformacin cancerosa lo antes posible. Cncer de mama  Practique la autoconciencia de la mama. ? Esto significa reconocer la apariencia normal de sus mamas y cmo las siente. ? Tambin significa realizar autoexmenes regulares de las mWoodward Informe a su mdico sobre cualquier cambio, sin importar cun pequeo sea.  Si es mayor de 40aos, visite a un mdico para qPublic librarianun examen de las mamas (exploracin clnica mamaria o ECM) todos los aOlathe En funcin de sToysRus los antecedentes familiares y la historia cGoodrich tal vez sea recomendable que tambin se haga una radiografa anual de las mamas ([place].  Si tiene antecedentes familiares de cncer de mama, hable con el mdico para someterse a un estudio gentico.  Si tiene alto riesgo de pChief Financial Officerde mama, hable con el mdico para hacerse a uPublic house manager(RM) y uLavinia Sharpstodos los aGeyser  La  evaluacin del gen del cncer de mama (BRCA) se recomienda a las mujeres que tengan familiares con tumores malignos relacionados con el BRCA. Los resultados de la evaluacin determinarn la necesidad de recibir asesoramiento gentico y pBuilding services engineerde deteccin del BRCA1 y el BRCA2. Los tumores malignos relacionados con el BRCA incluyen estos tipos: ? Mama. Este tipo se presenta en hombres o mujeres. ? Ovario. ? Trompas. A este tipo tambin se lo llama cncer de trompa de [place]. ? Cncer de la pared abdominal o plvica (cncer de peritoneo). ? Prstata. ? Pncreas. Cncer de cuello uterino, de tero y de ovario El mdico puede recomendarle que se haga pruebas peridicas de deteccin de cncer de los rganos de la pelvis, los cuales iVerizonovarios, el tero y la vagina. Estas pruebas incluyen un  examen plvico, que abarca controlar si se produjeron cambios microscpicos en la superficie del cuello del tero (prueba de Papanicolaou).  A las mujeres que tienen entre 21 y 65aos, los mdicos pueden recomendarles que se realicen un examen plvico y una prueba de Papanicolaou cada tres aos. A las mujeres que tienen entre 30 y 65aos, pueden recomendarles la prueba de Papanicolaou y el examen plvico, en combinacin con una prueba de deteccin del virus del papiloma humano (VPH) cada cinco aos. Algunos tipos de VPH aumentan el riesgo de padecer cncer de cuello del tero. La prueba para la deteccin del VPH tambin puede realizarse a mujeres de cualquier edad cuyos resultados de la prueba de Papanicolaou no sean claros.  Es posible que otros mdicos no recomienden exmenes de deteccin a las mujeres no embarazadas que se consideran sujetos de bajo riesgo de padecer cncer de pelvis y no tienen sntomas. Pregntele al mdico si un examen plvico de deteccin es adecuado para usted.  Si ha recibido un tratamiento para el cncer cervical o una enfermedad que podra causar cncer, necesitar realizarse una  prueba de Papanicolaou y controles durante al menos 20 aos de concluido el tratamiento. Si no se ha hecho el Papanicolaou con regularidad, debern volver a evaluarse los factores de riesgo (como tener un nuevo compaero sexual), para determinar si debe empezar a realizarse los estudios nuevamente. Algunas mujeres sufren problemas mdicos que aumentan la probabilidad de contraer cncer de cuello del tero. En estos casos, el mdico podr indicar que se realicen controles y pruebas de Papanicolaou con ms frecuencia.  Si tiene antecedentes familiares de cncer de tero o de ovario, hable con el mdico para someterse a un estudio gentico.  Si tiene hemorragia vaginal despus de la menopausia, informe al mdico.  En la actualidad, no hay pruebas confiables para la deteccin del cncer de ovario. Cncer de pulmn Se recomienda realizar exmenes de deteccin de cncer de pulmn a personas adultas entre 55 y 80 aos que estn en riesgo de desarrollar cncer de pulmn por sus antecedentes de consumo de tabaco. Se recomienda una tomografa computarizada (TC) de baja dosis de los pulmones todos los aos si usted:  Fuma actualmente.  Ha fumado durante 30aos un paquete diario y sigue fumando o dej el hbito en algn momento en los ltimos 15aos. Un paquete-ao equivale a fumar en promedio un paquete de cigarrillos diario durante un ao. Los exmenes de deteccin anuales:  Deben hacerse hasta que hayan pasado 15aos desde que dej de fumar.  Deben dejar de realizarse si tiene un problema de salud que le impide recibir tratamiento para el cncer de pulmn. Cncer colorrectal  Este tipo de cncer puede detectarse y a menudo prevenirse.  El estudio de rutina de deteccin del cncer colorrectal debe comenzar a hacerse a partir de los 50aos y hasta los 75aos.  El mdico puede aconsejarle que lo haga antes, si tiene factores de riesgo de tener cncer de colon.  Si tiene antecedentes familiares de  cncer colorrectal, hable con el mdico para someterse a un estudio gentico.  El mdico tambin puede recomendarle que use un kit de prueba para el hogar a fin de poder hallar sangre oculta en la materia fecal.  Es posible que se use una pequea cmara en el extremo de un tubo para examinar directamente el colon (sigmoidoscopia o colonoscopia) a fin de detectar formas tempranas de cncer colorrectal.  El examen directo del colon se debe repetir cada 5 a 10aos hasta los 75aos. Sin   embargo, si se hallan formas incipientes de plipos precancerosos o pequeos tumores, o si tiene antecedentes familiares o riesgo gentico de Therapist, music, debe realizarse exmenes de deteccin con ms frecuencia. Cncer de piel  Revise la piel de la cabeza a los pies con regularidad.  Contrlese los lunares. Infrmele al mdico: ? Si aparecen nuevos lunares o los que tiene se modifican, especialmente en su forma o color. ? Si tiene un lunar que es ms grande que el tamao de una goma de Games developer.  Si alguno de los miembros de su familia tiene antecedentes de cncer de piel, especialmente a una edad temprana, hable con el mdico para someterse a pruebas genticas.  Siempre use pantalla solar. Aplique pantalla solar de Kerry Dory y repetida a lo largo del Training and development officer.  Protjase usando mangas y [employer], un sombrero de ala ancha y gafas para el sol, siempre que est al [place]. QU DEBO SABER SOBRE LA OSTEOPOROSIS? La osteoporosis es una afeccin en la cual la destruccin de la masa sea ocurre con mayor rapidez que su formacin. Despus de la menopausia, puede correr un riesgo ms alto de tener osteoporosis. Para ayudar a prevenir esta afeccin o las fracturas seas que pueden ocurrir a causa de [place], se recomienda lo siguiente:  Si tiene entre 19 y 50aos, tome como mnimo 1080m de calcio y 6089mde vitaminaD por daTraining and development officer Si es mayor de 50aos pero menor de 70aos, tome como mnimo 120027mde calcio y 600m15m vitaminaD por da. Training and development officeri es mayor de 70aos, tome como mnimo 1200mg33mcalcio y 800mg 14mitaminaD por da. ElTraining and development officerabaquismo y el consumo excesivo de alcohol aumentan el riesgo de osteoporosis. Consuma alimentos ricos en calcio y vitaminaD, y haga ejercicios con soporte de peso varias veces a la semana, como se lo haya indicado el mdico. QU DEBO SABER SOBRE EL MODO EN QUE LA MENOPAUSIA AFECTA MI SALUD MENTAL? La depresin puede presentarse a cualquier edad, pero es ms frecuente a medida que una persona envejece. Los sntomas comunes de depresin incluyen lo siguiente:  Desnimo o tristeza.  Cambios en los patrones de sueo.  Cambios en el apetito o en los hbitos de alimentacin.  Sensacin de falta general de motivacin o placer al realizYahooidades que sola disfrutar.  Crisis frecuentes de llanto. Hable con el mdico si cree que tiene depresin. QU DEBO SABER SOBRE LAS VACUNAS? Es importante que se aplique las vacunas y las maBondvilles incluyen las siguientes:  Vacuna contra el ttanos, la difteria y la tosferina (Tdap).  Vacuna anual contra la gripe antes del inicio de la temporada de gripe.  Vacuna contra la neumona.  Vacuna contra el herpes. El mdico tambin puede recomendarle que se aplique otras vacunas. Esta informacin no tiene como fMarine scientistnsejo del mdico. Asegrese de hacerle al mdico cualquier pregunta que tenga. Document Released: 04/18/2013 Document Revised: 07/19/2014 Document Reviewed: 04/01/2015 Elsevier Interactive Patient Education  2018 Else[employer]

## 2017-09-29 LAB — COMPREHENSIVE METABOLIC PANEL
AG Ratio: 1.3 (calc) (ref 1.0–2.5)
ALBUMIN MSPROF: 4 g/dL (ref 3.6–5.1)
ALT: 13 U/L (ref 6–29)
AST: 12 U/L (ref 10–35)
Alkaline phosphatase (APISO): 77 U/L (ref 33–130)
BILIRUBIN TOTAL: 0.2 mg/dL (ref 0.2–1.2)
BUN: 14 mg/dL (ref 7–25)
CO2: 31 mmol/L (ref 20–32)
Calcium: 8.9 mg/dL (ref 8.6–10.4)
Chloride: 103 mmol/L (ref 98–110)
Creat: 0.81 mg/dL (ref 0.50–1.05)
GLOBULIN: 3.1 g/dL (ref 1.9–3.7)
Glucose, Bld: 94 mg/dL (ref 65–99)
POTASSIUM: 3.7 mmol/L (ref 3.5–5.3)
SODIUM: 140 mmol/L (ref 135–146)
Total Protein: 7.1 g/dL (ref 6.1–8.1)

## 2017-09-29 LAB — CBC WITH DIFFERENTIAL/PLATELET
BASOS PCT: 0.5 %
Basophils Absolute: 46 cells/uL (ref 0–200)
EOS PCT: 2.3 %
Eosinophils Absolute: 212 cells/uL (ref 15–500)
HEMATOCRIT: 35 % (ref 35.0–45.0)
Hemoglobin: 12 g/dL (ref 11.7–15.5)
LYMPHS ABS: 2576 {cells}/uL (ref 850–3900)
MCH: 29.2 pg (ref 27.0–33.0)
MCHC: 34.3 g/dL (ref 32.0–36.0)
MCV: 85.2 fL (ref 80.0–100.0)
MPV: 10.1 fL (ref 7.5–12.5)
Monocytes Relative: 5.5 %
NEUTROS PCT: 63.7 %
Neutro Abs: 5860 cells/uL (ref 1500–7800)
Platelets: 303 10*3/uL (ref 140–400)
RBC: 4.11 10*6/uL (ref 3.80–5.10)
RDW: 12.7 % (ref 11.0–15.0)
Total Lymphocyte: 28 %
WBC mixed population: 506 cells/uL (ref 200–950)
WBC: 9.2 10*3/uL (ref 3.8–10.8)

## 2017-09-29 LAB — FOLLICLE STIMULATING HORMONE: FSH: 138.9 m[IU]/mL — ABNORMAL HIGH

## 2017-10-18 ENCOUNTER — Encounter: Payer: BLUE CROSS/BLUE SHIELD | Admitting: Women's Health

## 2017-10-26 ENCOUNTER — Other Ambulatory Visit: Payer: Self-pay | Admitting: Women's Health

## 2017-10-26 DIAGNOSIS — Z1231 Encounter for screening mammogram for malignant neoplasm of breast: Secondary | ICD-10-CM

## 2017-11-01 ENCOUNTER — Telehealth: Payer: Self-pay | Admitting: *Deleted

## 2017-11-01 NOTE — Telephone Encounter (Signed)
patient daughter called stating her mother has been having issues getting her HRT medication, I confirmed the pharmacy was correct which it was. I called [facility] and they had Rx just never filled it! I asked them to please fill Rx for patient, I called the patient daughter back and told her they will fill Rx for her.

## 2017-11-15 ENCOUNTER — Ambulatory Visit: Payer: BLUE CROSS/BLUE SHIELD

## 2017-11-30 ENCOUNTER — Ambulatory Visit
Admission: RE | Admit: 2017-11-30 | Discharge: 2017-11-30 | Disposition: A | Discharge status: Home / Self Care | Payer: BLUE CROSS/BLUE SHIELD | Source: Ambulatory Visit | Attending: Women's Health | Admitting: Women's Health

## 2017-11-30 DIAGNOSIS — Z1231 Encounter for screening mammogram for malignant neoplasm of breast: Secondary | ICD-10-CM

## 2018-05-24 ENCOUNTER — Telehealth: Payer: Self-pay | Admitting: *Deleted

## 2018-05-24 NOTE — Telephone Encounter (Signed)
Pt was a pt of Dr. Evelena Leyden and has had sclero by me. Calling today to report a new bulging vein right under her left buttock at the top of her rear thigh. Wonders if something can be done. Scheduled a vv fu with Scot Dock without a lab to have him take a look before scheduling a reflux study. Her last one showed no significant reflux (2018).

## 2018-06-22 ENCOUNTER — Ambulatory Visit (INDEPENDENT_AMBULATORY_CARE_PROVIDER_SITE_OTHER): Payer: BLUE CROSS/BLUE SHIELD

## 2018-06-22 ENCOUNTER — Encounter: Payer: Self-pay | Admitting: Podiatry

## 2018-06-22 ENCOUNTER — Ambulatory Visit: Payer: BLUE CROSS/BLUE SHIELD | Admitting: Podiatry

## 2018-06-22 VITALS — BP 133/81 | HR 65 | Resp 16

## 2018-06-22 DIAGNOSIS — M2011 Hallux valgus (acquired), right foot: Secondary | ICD-10-CM

## 2018-06-22 NOTE — Patient Instructions (Signed)
Pre-Operative Instructions  Congratulations, you have decided to take an important step towards improving your quality of life.  You can be assured that the doctors and staff at [facility] will be with you every step of the way.  Here are some important things you should know:  1. Plan to be at the surgery center/hospital at least 1 (one) hour prior to your scheduled time, unless otherwise directed by the surgical center/hospital staff.  You must have a responsible adult accompany you, remain during the surgery and drive you home.  Make sure you have directions to the surgical center/hospital to ensure you arrive on time. 2. If you are having surgery at [facility] or [facility], you will need a copy of your medical history and physical form from your family physician within one month prior to the date of surgery. We will give you a form for your primary physician to complete.  3. We make every effort to accommodate the date you request for surgery.  However, there are times where surgery dates or times have to be moved.  We will contact you as soon as possible if a change in schedule is required.   4. No aspirin/ibuprofen for one week before surgery.  If you are on aspirin, any non-steroidal anti-inflammatory medications (Mobic, Aleve, Ibuprofen) should not be taken seven (7) days prior to your surgery.  You make take Tylenol for pain prior to surgery.  5. Medications - If you are taking daily heart and blood pressure medications, seizure, reflux, allergy, asthma, anxiety, pain or diabetes medications, make sure you notify the surgery center/hospital before the day of surgery so they can tell you which medications you should take or avoid the day of surgery. 6. No food or drink after midnight the night before surgery unless directed otherwise by surgical center/hospital staff. 7. No alcoholic beverages 24-hours prior to surgery.  No smoking 24-hours prior or 24-hours after  surgery. 8. Wear loose pants or shorts. They should be loose enough to fit over bandages, boots, and casts. 9. Don't wear slip-on shoes. Sneakers are preferred. 10. Bring your boot with you to the surgery center/hospital.  Also bring crutches or a walker if your physician has prescribed it for you.  If you do not have this equipment, it will be provided for you after surgery. 11. If you have not been contacted by the surgery center/hospital by the day before your surgery, call to confirm the date and time of your surgery. 12. Leave-time from work may vary depending on the type of surgery you have.  Appropriate arrangements should be made prior to surgery with your employer. 13. Prescriptions will be provided immediately following surgery by your doctor.  Fill these as soon as possible after surgery and take the medication as directed. Pain medications will not be refilled on weekends and must be approved by the doctor. 14. Remove nail polish on the operative foot and avoid getting pedicures prior to surgery. 15. Wash the night before surgery.  The night before surgery wash the foot and leg well with water and the antibacterial soap provided. Be sure to pay special attention to beneath the toenails and in between the toes.  Wash for at least three (3) minutes. Rinse thoroughly with water and dry well with a towel.  Perform this wash unless told not to do so by your physician.  Enclosed: 1 Ice pack (please put in freezer the night before surgery)   1 Hibiclens skin cleaner     Pre-op instructions  If you have any questions regarding the instructions, please do not hesitate to call our office.  [place]: [address], [place], [place] [address] -- [phone]  [place]: [address] [place], [place] [address] -- [phone]  [place]: [address]  , [place] [address] -- [phone]  [place]: [address], [address], [place],  [address] -- [phone]  Website:  [email or web address] 

## 2018-06-22 NOTE — Progress Notes (Signed)
  Subjective:  Patient ID: Caitlin Dunlap, female    DOB: 27-Jun-1966,  MRN: [id number] HPI Chief Complaint  Patient presents with  . Foot Pain    1st MPJ right - aching x years, getting worse, shoes uncomfortable  . New Patient (Initial Visit)    52 y.o. female presents with the above complaint.   ROS: Denies fever chills nausea vomiting muscle aches pains calf pain back pain chest pain shortness of breath.  Past Medical History:  Diagnosis Date  . Anemia   . Hashimoto's thyroiditis   . Migraine   . Osteoarthritis    as a child   Past Surgical History:  Procedure Laterality Date  . INTRAUTERINE DEVICE INSERTION  5/13   Paragard  . LIPOMA RESECTION     FROM RIGHT UNDERARM  . VARICOSE VEIN SURGERY  2004    Current Outpatient Medications:  .  estradiol (ESTRACE) 0.5 MG tablet, Take 1 tablet (0.5 mg total) by mouth daily., Disp: 90 tablet, Rfl: 4  No Known Allergies Review of Systems Objective:   Vitals:   06/22/18 1102  BP: 133/81  Pulse: 65  Resp: 16    General: Well developed, nourished, in no acute distress, alert and oriented x3   Dermatological: Skin is warm, dry and supple bilateral. Nails x 10 are well maintained; remaining integument appears unremarkable at this time. There are no open sores, no preulcerative lesions, no rash or signs of infection present.  Vascular: Dorsalis Pedis artery and Posterior Tibial artery pedal pulses are 2/4 bilateral with immedate capillary fill time. Pedal hair growth present. No varicosities and no lower extremity edema present bilateral.   Neruologic: Grossly intact via light touch bilateral. Vibratory intact via tuning fork bilateral. Protective threshold with Semmes Wienstein monofilament intact to all pedal sites bilateral. Patellar and Achilles deep tendon reflexes 2+ bilateral. No Babinski or clonus noted bilateral.   Musculoskeletal: No gross boney pedal deformities bilateral. No pain, crepitus, or limitation noted with  foot and ankle range of motion bilateral. Muscular strength 5/5 in all groups tested bilateral.  She has pain on palpation and range of motion of the first metatarsophalangeal joint of the right foot.  Hallux valgus deformities noted.  Gait: Unassisted, Nonantalgic.    Radiographs:  Radiographs demonstrate increase in the percent metatarsal angle greater than normal value with hypertrophic medial condyle.  Assessment & Plan:   Assessment: Hallux abductovalgus deformity hypertrophic medial condyle.    Plan: We discussed etiology pathology and surgical therapies at this point we consented her for a bunionectomy McBride of the right foot.  This was translated to her and she understands it and is amenable to it.  We did discuss the possible postop complications which may include but not limited to postop pain bleeding scar infection recurrence need further surgery or correction under correction.  Dispensed a Cam walker and information regarding the surgery center in the morning of preop.  She understands this is amenable to it.  We will follow-up with her in the near future for surgical intervention.     Aquiles Ruffini T. [place], [place]

## 2018-07-03 ENCOUNTER — Other Ambulatory Visit: Payer: Self-pay | Admitting: Podiatry

## 2018-07-03 MED ORDER — CEPHALEXIN 500 MG PO CAPS: 500.0000 mg | ORAL_CAPSULE | Freq: Three times a day (TID) | ORAL | 0 refills | Status: DC

## 2018-07-03 MED ORDER — ONDANSETRON HCL 4 MG PO TABS: 4.0000 mg | ORAL_TABLET | Freq: Three times a day (TID) | ORAL | 0 refills | Status: DC | PRN

## 2018-07-03 MED ORDER — OXYCODONE-ACETAMINOPHEN 10-325 MG PO TABS: 1.0000 | ORAL_TABLET | Freq: Four times a day (QID) | ORAL | 0 refills | Status: AC | PRN

## 2018-07-10 ENCOUNTER — Encounter: Payer: Self-pay | Admitting: Podiatry

## 2018-07-10 DIAGNOSIS — M2011 Hallux valgus (acquired), right foot: Secondary | ICD-10-CM | POA: Diagnosis not present

## 2018-07-13 ENCOUNTER — Encounter: Payer: Self-pay | Admitting: *Deleted

## 2018-07-13 ENCOUNTER — Other Ambulatory Visit: Payer: Self-pay | Admitting: Podiatry

## 2018-07-13 ENCOUNTER — Telehealth: Payer: Self-pay | Admitting: Podiatry

## 2018-07-13 MED ORDER — PROMETHAZINE HCL 25 MG PO TABS: 25.0000 mg | ORAL_TABLET | Freq: Three times a day (TID) | ORAL | 0 refills | Status: DC | PRN

## 2018-07-13 MED ORDER — HYDROCODONE-ACETAMINOPHEN 10-325 MG PO TABS: 1.0000 | ORAL_TABLET | ORAL | 0 refills | Status: DC | PRN

## 2018-07-13 MED ORDER — CLINDAMYCIN HCL 150 MG PO CAPS: 150.0000 mg | ORAL_CAPSULE | Freq: Two times a day (BID) | ORAL | 0 refills | Status: DC

## 2018-07-13 NOTE — Telephone Encounter (Signed)
I called pt's interpreter, [name] and she states she will have someone pick up the medication and to please print instructions on comfort measures concerning the surgery boot and wrap. Letter printed and placed with rxs.

## 2018-07-13 NOTE — Progress Notes (Signed)
Rx sent 

## 2018-07-13 NOTE — Telephone Encounter (Signed)
Pt had surgery on 12/30 and has had trouble getting medications from current pharmacy and would like to have the medications all sent to [facility] on [address].

## 2018-07-13 NOTE — Telephone Encounter (Signed)
Caitlin Dunlap, interpreter states the pain medication helping and bothers pt's stomach, itching reaction and the nausea medication is not working. I informed [name], the antibiotic if to be changed and the nausea medication would be sent to the [facility] requested, but the remaining Percocet pills would need to be brought to the [place] office to be destroyed and an alternate prescription picked up at that time. I told [name] I would call with the rx information. [name] states understanding.

## 2018-07-18 ENCOUNTER — Encounter: Payer: Self-pay | Admitting: Podiatry

## 2018-07-18 ENCOUNTER — Ambulatory Visit (INDEPENDENT_AMBULATORY_CARE_PROVIDER_SITE_OTHER): Payer: BLUE CROSS/BLUE SHIELD

## 2018-07-18 ENCOUNTER — Ambulatory Visit (INDEPENDENT_AMBULATORY_CARE_PROVIDER_SITE_OTHER): Payer: BLUE CROSS/BLUE SHIELD | Admitting: Podiatry

## 2018-07-18 DIAGNOSIS — M2011 Hallux valgus (acquired), right foot: Secondary | ICD-10-CM

## 2018-07-18 DIAGNOSIS — Z9889 Other specified postprocedural states: Secondary | ICD-10-CM

## 2018-07-19 ENCOUNTER — Ambulatory Visit: Payer: BLUE CROSS/BLUE SHIELD | Admitting: Vascular Surgery

## 2018-07-19 NOTE — Progress Notes (Signed)
She presents today date of surgery 07/10/2018 status post McBride bunionectomy right foot.  States that is perfect and does not hurt at all.  Objective: Vital signs are stable she is alert and oriented x3.  There is no erythematous mild edema no cellulitis drainage or odor.  Incision appears to be perfect there is no signs of infection no open lesions or wounds.  Good range of motion.  Radiograph confirms a nice Dentist.  Plan: Encourage range of motion exercises redressed today with a light dressing placed her in a Darco shoe and encouraged her to continue to keep this dry and elevate as much as possible follow-up with her in 1 week for suture removal.

## 2018-07-20 ENCOUNTER — Other Ambulatory Visit: Payer: Self-pay

## 2018-07-20 ENCOUNTER — Ambulatory Visit (INDEPENDENT_AMBULATORY_CARE_PROVIDER_SITE_OTHER): Payer: BLUE CROSS/BLUE SHIELD | Admitting: Vascular Surgery

## 2018-07-20 ENCOUNTER — Encounter: Payer: Self-pay | Admitting: Vascular Surgery

## 2018-07-20 VITALS — BP 126/82 | HR 81 | Temp 98.7°F | Resp 16 | Ht 65.0 in | Wt 153.0 lb

## 2018-07-20 DIAGNOSIS — R2242 Localized swelling, mass and lump, left lower limb: Secondary | ICD-10-CM

## 2018-07-20 NOTE — Progress Notes (Signed)
REASON FOR CONSULT:    New varicose vein in left thigh posteriorly.  HPI:   Caitlin Dunlap is a pleasant 53 y.o. female, who is previously been followed in our office by Dr. Kellie Simmering.  She was most recently seen on 10/05/2016.  At that time, she had aching discomfort and itching in both thighs and calves which was aggravated by standing.  She was a Theme park manager.  She occasionally wore compression stockings which helped her some.  She had a noninvasive study at that visit which showed no evidence of DVT and both great saphenous veins were patent with no reflux and were normal in size.  The possibility of having foam sclerotherapy was discussed with the patient and it looks like she ultimately underwent sclerotherapy in our office.  She called complaining of a new bulging vein under her left buttock.  She was scheduled for an office visit.  Her only complaint today is a "mass" on the posterior aspect of her left thigh.  This began several months ago but she thinks this has gradually gotten larger and was concerned this might be a varicose vein.  She denies any significant pain associated with this.  She denies any aching pain or heaviness in her legs.  She does not wear compression stockings.  She has no history of DVT or phlebitis.  Past Medical History:  Diagnosis Date  . Anemia   . Hashimoto's thyroiditis   . Migraine   . Osteoarthritis    as a child    Family History  Problem Relation Age of Onset  . Diabetes Maternal Aunt     SOCIAL HISTORY: Social History   Socioeconomic History  . Marital status: Married    Spouse name: Not on file  . Number of children: 3  . Years of education: Not on file  . Highest education level: Not on file  Occupational History  . Occupation: Public affairs consultant: [employer]  Social Needs  . Financial resource strain: Not on file  . Food insecurity:    Worry: Not on file    Inability: Not on file  . Transportation needs:    Medical: Not on  file    Non-medical: Not on file  Tobacco Use  . Smoking status: Never Smoker  . Smokeless tobacco: Never Used  Substance and Sexual Activity  . Alcohol use: No  . Drug use: No  . Sexual activity: Yes    Birth control/protection: I.U.D.    Comment: paragard  Lifestyle  . Physical activity:    Days per week: Not on file    Minutes per session: Not on file  . Stress: Not on file  Relationships  . Social connections:    Talks on phone: Not on file    Gets together: Not on file    Attends religious service: Not on file    Active member of club or organization: Not on file    Attends meetings of clubs or organizations: Not on file    Relationship status: Not on file  . Intimate partner violence:    Fear of current or ex partner: Not on file    Emotionally abused: Not on file    Physically abused: Not on file    Forced sexual activity: Not on file  Other Topics Concern  . Not on file  Social History Narrative  . Not on file    No Known Allergies  Current Outpatient Medications  Medication Sig Dispense Refill  .  clindamycin (CLEOCIN) 150 MG capsule Take 1 capsule (150 mg total) by mouth 2 (two) times daily. 14 capsule 0  . estradiol (ESTRACE) 0.5 MG tablet Take 1 tablet (0.5 mg total) by mouth daily. 90 tablet 4  . HYDROcodone-acetaminophen (NORCO) 10-325 MG tablet Take 1 tablet by mouth every 4 (four) hours as needed. 20 tablet 0  . ondansetron (ZOFRAN) 4 MG tablet Take 1 tablet (4 mg total) by mouth every 8 (eight) hours as needed for nausea or vomiting. 20 tablet 0  . promethazine (PHENERGAN) 25 MG tablet Take 1 tablet (25 mg total) by mouth every 8 (eight) hours as needed for nausea or vomiting. 20 tablet 0   No current facility-administered medications for this visit.     REVIEW OF SYSTEMS:  [X]  denotes positive finding, [ ]  denotes negative finding Cardiac  Comments:  Chest pain or chest pressure:    Shortness of breath upon exertion:    Short of breath when lying  flat:    Irregular heart rhythm:        Vascular    Pain in calf, thigh, or hip brought on by ambulation:    Pain in feet at night that wakes you up from your sleep:     Blood clot in your veins:    Leg swelling:         Pulmonary    Oxygen at home:    Productive cough:     Wheezing:         Neurologic    Sudden weakness in arms or legs:     Sudden numbness in arms or legs:     Sudden onset of difficulty speaking or slurred speech:    Temporary loss of vision in one eye:     Problems with dizziness:         Gastrointestinal    Blood in stool:     Vomited blood:         Genitourinary    Burning when urinating:     Blood in urine:        Psychiatric    Major depression:         Hematologic    Bleeding problems:    Problems with blood clotting too easily:        Skin    Rashes or ulcers:        Constitutional    Fever or chills:     PHYSICAL EXAM:   Vitals:   07/20/18 1614  BP: 126/82  Pulse: 81  Resp: 16  Temp: 98.7 F (37.1 C)  TempSrc: Oral  SpO2: 100%  Weight: 153 lb (69.4 kg)  Height: 5\' 5"  (1.651 m)    GENERAL: The patient is a well-nourished female, in no acute distress. The vital signs are documented above. CARDIAC: There is a regular rate and rhythm.  VASCULAR: She has spider veins in her thighs and legs bilaterally.  She has no significant large truncal varicosities. PULMONARY: There is good air exchange bilaterally without wheezing or rales. SKIN: She has a round smooth soft protrusion of the posterior aspect of her left thigh just below her buttocks which is not indurated or inflamed.  I did look at this with the SonoSite and it does not appear to have any flow within it to suggest a connection with the vein. PSYCHIATRIC: The patient has a normal affect.  DATA:    No new studies.  ASSESSMENT & PLAN:   SMALL MASS LEFT POSTERIOR THIGH: It is  difficult to determine what the small mass is.  It is soft and may simply be a fluid filled  collection.  It does not appear to be a lipoma.  It does not appear to be venous by SonoSite.  She does think this is enlarging and if this is bothering her I think the only option would be to excise this.  Her daughter is familiar with a Psychiatric nurse in town so she will make arrangements to see him and discuss this option.  I tried to reassure her that I did not think this was anything serious.   Deitra Mayo [facility] [phone]

## 2018-07-21 ENCOUNTER — Telehealth: Payer: Self-pay | Admitting: *Deleted

## 2018-07-21 MED ORDER — ESTRADIOL 0.5 MG PO TABS: 0.5000 mg | ORAL_TABLET | Freq: Every day | ORAL | 0 refills | Status: DC

## 2018-07-21 MED ORDER — MEDROXYPROGESTERONE ACETATE 10 MG PO TABS: 10.0000 mg | ORAL_TABLET | Freq: Every day | ORAL | 0 refills | Status: DC

## 2018-07-21 NOTE — Telephone Encounter (Signed)
Patient daughter called asking if medication for estradiol and provera could be sent to [facility]. Rx sent.

## 2018-07-25 ENCOUNTER — Ambulatory Visit (INDEPENDENT_AMBULATORY_CARE_PROVIDER_SITE_OTHER): Payer: BLUE CROSS/BLUE SHIELD | Admitting: Podiatry

## 2018-07-25 DIAGNOSIS — M2011 Hallux valgus (acquired), right foot: Secondary | ICD-10-CM | POA: Diagnosis not present

## 2018-07-25 DIAGNOSIS — Z9889 Other specified postprocedural states: Secondary | ICD-10-CM

## 2018-07-26 NOTE — Progress Notes (Signed)
She presents today for follow-up of her Caitlin Dunlap bunionectomy she states that she is very happy she has not had a lot of pain she denies fever chills nausea vomiting muscle aches pains.  Objective: Vital signs are stable she alert oriented times 3 sutures intact margins well coapted minimal edema no erythema cellulitis drainage or odor.  Assessment: Healed surgical foot.  Plan: Encourage range of motion exercises massage therapy I will let her start getting this wet sutures were removed today.  Follow-up with me in 2 weeks.  I told her that she could try getting into a loose pair shoes.

## 2018-08-08 ENCOUNTER — Encounter: Payer: BLUE CROSS/BLUE SHIELD | Admitting: *Deleted

## 2018-08-08 NOTE — Progress Notes (Signed)
This encounter was created in error - please disregard.  This encounter was created in error - please disregard.

## 2018-08-16 ENCOUNTER — Telehealth: Payer: Self-pay | Admitting: Podiatry

## 2018-08-16 NOTE — Telephone Encounter (Signed)
Pt's mothers right foot is swollen, purple, and has a rash that itches. Had surgery on this foot on 30 December. Would like to send pictures of foot if possible. Requested a call back as soon as possible.

## 2018-08-16 NOTE — Telephone Encounter (Signed)
I just got off work and I'm sorry I missed your call. If you could please call me back.

## 2018-08-16 NOTE — Telephone Encounter (Signed)
Called and left message that I scheduled pt's mother for 2:00 pm tomorrow with Dr. Milinda Pointer. Asked her to call us back to confirm she received my voicemail.

## 2018-08-16 NOTE — Telephone Encounter (Signed)
I called spoke to pt's dtr, she states the foot is purplish, and has a rash. I asked if pt had a fever, red streaks and she stated no. I asked if pt was working and she states she pt works at a [employer]. I told Caitlin Dunlap pt's feet were below her heart for too long and her foot was having to accommodate for day to day swelling as well as surgery swelling and she should elevate as much as possible, that the foot may swell on and off to varying degrees for up to 6-9 months, and offered her an appt tomorrow at 2:00pm.

## 2018-08-17 ENCOUNTER — Ambulatory Visit (INDEPENDENT_AMBULATORY_CARE_PROVIDER_SITE_OTHER): Payer: BLUE CROSS/BLUE SHIELD | Admitting: Podiatry

## 2018-08-17 ENCOUNTER — Encounter: Payer: Self-pay | Admitting: Podiatry

## 2018-08-17 DIAGNOSIS — L309 Dermatitis, unspecified: Secondary | ICD-10-CM

## 2018-08-17 DIAGNOSIS — M2011 Hallux valgus (acquired), right foot: Secondary | ICD-10-CM

## 2018-08-17 DIAGNOSIS — Z9889 Other specified postprocedural states: Secondary | ICD-10-CM

## 2018-08-17 MED ORDER — METHYLPREDNISOLONE 4 MG PO TBPK: ORAL_TABLET | ORAL | 0 refills | Status: DC

## 2018-08-17 MED ORDER — AMMONIUM LACTATE 12 % EX LOTN: 1.0000 "application " | TOPICAL_LOTION | CUTANEOUS | 5 refills | Status: DC | PRN

## 2018-08-17 NOTE — Progress Notes (Signed)
She presents today with her interpreter date of surgery is 07/10/2018 status post Lauro Regulus bunionectomy right she states that it feels fine but is been itching and turned purple.  She states that I will work a lot and I have my feet dependent and she explained through the interpreter.  Objective: Vital signs are stable she is alert and oriented x3.  Pulses are palpable.  Mild postinflammatory hyperpigmentation great range of motion of the first metatarsophalangeal joint no signs of infection whatsoever.  Assessment: Well-healing surgical foot.  Plan: Start her on a Benadryl topical anti-itch cream and also wrote a prescription for Medrol Dosepak follow-up with her in 1 month if necessary.

## 2018-08-22 ENCOUNTER — Other Ambulatory Visit: Payer: BLUE CROSS/BLUE SHIELD

## 2018-08-24 ENCOUNTER — Other Ambulatory Visit: Payer: BLUE CROSS/BLUE SHIELD

## 2018-10-07 ENCOUNTER — Encounter (HOSPITAL_BASED_OUTPATIENT_CLINIC_OR_DEPARTMENT_OTHER): Payer: Self-pay | Admitting: *Deleted

## 2018-10-07 ENCOUNTER — Emergency Department (HOSPITAL_BASED_OUTPATIENT_CLINIC_OR_DEPARTMENT_OTHER): Payer: BLUE CROSS/BLUE SHIELD

## 2018-10-07 ENCOUNTER — Other Ambulatory Visit: Payer: Self-pay

## 2018-10-07 ENCOUNTER — Emergency Department (HOSPITAL_BASED_OUTPATIENT_CLINIC_OR_DEPARTMENT_OTHER)
Admission: EM | Admit: 2018-10-07 | Discharge: 2018-10-07 | Disposition: A | Discharge status: Home / Self Care | Payer: BLUE CROSS/BLUE SHIELD | Attending: Emergency Medicine | Admitting: Emergency Medicine

## 2018-10-07 DIAGNOSIS — R519 Headache, unspecified: Secondary | ICD-10-CM

## 2018-10-07 DIAGNOSIS — R55 Syncope and collapse: Secondary | ICD-10-CM | POA: Diagnosis not present

## 2018-10-07 DIAGNOSIS — E063 Autoimmune thyroiditis: Secondary | ICD-10-CM | POA: Insufficient documentation

## 2018-10-07 DIAGNOSIS — R51 Headache: Secondary | ICD-10-CM

## 2018-10-07 LAB — BASIC METABOLIC PANEL
ANION GAP: 8 (ref 5–15)
BUN: 10 mg/dL (ref 6–20)
CO2: 27 mmol/L (ref 22–32)
Calcium: 8.4 mg/dL — ABNORMAL LOW (ref 8.9–10.3)
Chloride: 102 mmol/L (ref 98–111)
Creatinine, Ser: 0.62 mg/dL (ref 0.44–1.00)
GFR calc Af Amer: >60 mL/min (ref >60)
Glucose, Bld: 125 mg/dL — ABNORMAL HIGH (ref 70–99)
POTASSIUM: 3.2 mmol/L — AB (ref 3.5–5.1)
Sodium: 137 mmol/L (ref 135–145)

## 2018-10-07 LAB — CBC
HEMATOCRIT: 42.1 % (ref 36.0–46.0)
Hemoglobin: 13.5 g/dL (ref 12.0–15.0)
MCH: 28.4 pg (ref 26.0–34.0)
MCHC: 32.1 g/dL (ref 30.0–36.0)
MCV: 88.6 fL (ref 80.0–100.0)
NRBC: 0 % (ref 0.0–0.2)
Platelets: 143 10*3/uL — ABNORMAL LOW (ref 150–400)
RBC: 4.75 MIL/uL (ref 3.87–5.11)
RDW: 13.2 % (ref 11.5–15.5)
WBC: 4.3 10*3/uL (ref 4.0–10.5)

## 2018-10-07 MED ORDER — DIPHENHYDRAMINE HCL 50 MG/ML IJ SOLN
25.0000 mg | Freq: Once | INTRAMUSCULAR | Status: AC
  Administered 2018-10-07: 25 mg via INTRAVENOUS
  Filled 2018-10-07: qty 1

## 2018-10-07 MED ORDER — SODIUM CHLORIDE 0.9 % IV BOLUS
1000.0000 mL | Freq: Once | INTRAVENOUS | Status: AC
  Administered 2018-10-07: 1000 mL via INTRAVENOUS

## 2018-10-07 MED ORDER — PROCHLORPERAZINE MALEATE 10 MG PO TABS: 10.0000 mg | ORAL_TABLET | Freq: Two times a day (BID) | ORAL | 0 refills | Status: DC | PRN

## 2018-10-07 MED ORDER — PROCHLORPERAZINE EDISYLATE 10 MG/2ML IJ SOLN
10.0000 mg | Freq: Once | INTRAMUSCULAR | Status: AC
  Administered 2018-10-07: 10 mg via INTRAVENOUS
  Filled 2018-10-07: qty 2

## 2018-10-07 NOTE — ED Triage Notes (Signed)
Per family (interpreting) pt had syncopal episode around 645pm. States she has nausea x 3 days and hasn't been eating well. Denies fever. Denies sick contacts.

## 2018-10-07 NOTE — Discharge Instructions (Addendum)
You were evaluated in the Emergency Department and after careful evaluation, we did not find any emergent condition requiring admission or further testing in the hospital.  Your symptoms today seem to be due to migraine headache.  You can use the medication provided as needed for headache or nausea.  Please follow-up with the neurologists for help with preventing migraines.  Please return to the Emergency Department if you experience any worsening of your condition such as numbness or weakness.  We encourage you to follow up with a primary care provider.  Thank you for allowing [place] to be a part of your care.

## 2018-10-07 NOTE — ED Notes (Signed)
Patient transported to CT 

## 2018-10-07 NOTE — ED Provider Notes (Signed)
[facility] Emergency Department Provider Note MRN:  [id number]  [place] date & time: 10/07/18     Chief Complaint   Loss of Consciousness   History of Present Illness   Caitlin Dunlap is a 53 y.o. year-old female with a history of migraines presenting to the ED with chief complaint of headache.  2 weeks of persistent headache, located diffusely around the head, has been constant, worse with moving or bright lights or loud noises.  Today, 1 hour prior to arrival patient had just use the bathroom when she became lightheaded, felt as if she was going to pass out.  She then sat down and fainted.  Family reports that she was making abnormal faces when she was down, and it seemed as if the right side of her face was not working.  She quickly woke up and was back to normal.  Continued headache.  Denies fever, no chest pain or shortness of breath, no abdominal pain, no numbness weakness to the arms or legs.  Review of Systems  A complete 10 system review of systems was obtained and all systems are negative except as noted in the HPI and PMH.   Patient's Health History    Past Medical History:  Diagnosis Date  . Anemia   . Hashimoto's thyroiditis   . Migraine   . Osteoarthritis    as a child    Past Surgical History:  Procedure Laterality Date  . INTRAUTERINE DEVICE INSERTION  5/13   Paragard  . LIPOMA RESECTION     FROM RIGHT UNDERARM  . VARICOSE VEIN SURGERY  2004    Family History  Problem Relation Age of Onset  . Diabetes Maternal Aunt     Social History   Socioeconomic History  . Marital status: Married    Spouse name: Not on file  . Number of children: 3  . Years of education: Not on file  . Highest education level: Not on file  Occupational History  . Occupation: Public affairs consultant: [employer]  Social Needs  . Financial resource strain: Not on file  . Food insecurity:    Worry: Not on file    Inability: Not on file  .  Transportation needs:    Medical: Not on file    Non-medical: Not on file  Tobacco Use  . Smoking status: Never Smoker  . Smokeless tobacco: Never Used  Substance and Sexual Activity  . Alcohol use: No  . Drug use: No  . Sexual activity: Yes    Birth control/protection: I.U.D.    Comment: paragard  Lifestyle  . Physical activity:    Days per week: Not on file    Minutes per session: Not on file  . Stress: Not on file  Relationships  . Social connections:    Talks on phone: Not on file    Gets together: Not on file    Attends religious service: Not on file    Active member of club or organization: Not on file    Attends meetings of clubs or organizations: Not on file    Relationship status: Not on file  . Intimate partner violence:    Fear of current or ex partner: Not on file    Emotionally abused: Not on file    Physically abused: Not on file    Forced sexual activity: Not on file  Other Topics Concern  . Not on file  Social History Narrative  . Not on file  Physical Exam  Vital Signs and Nursing Notes reviewed Vitals:   10/07/18 2030 10/07/18 2200  BP: 115/70 117/68  Pulse: 81 87  Resp: (!) 22 16  Temp:    SpO2: 95% 98%    CONSTITUTIONAL: Well-appearing, NAD NEURO:  Alert and oriented x 3, no focal deficits EYES:  eyes equal and reactive ENT/NECK:  no LAD, no JVD CARDIO: Regular rate, well-perfused, normal S1 and S2 PULM:  CTAB no wheezing or rhonchi GI/GU:  normal bowel sounds, non-distended, non-tender MSK/SPINE:  No gross deformities, no edema SKIN:  no rash, atraumatic PSYCH:  Appropriate speech and behavior  Diagnostic and Interventional Summary    EKG Interpretation  Date/Time:  Saturday October 07 2018 19:44:31 EDT Ventricular Rate:  81 PR Interval:    QRS Duration: 91 QT Interval:  401 QTC Calculation: 466 R Axis:   13 Text Interpretation:  Sinus rhythm Baseline wander in lead(s) II III aVF Confirmed by Gerlene Fee [phone]) on 10/07/2018  8:03:57 PM      Labs Reviewed  CBC - Abnormal; Notable for the following components:      Result Value   Platelets 143 (*)    All other components within normal limits  BASIC METABOLIC PANEL - Abnormal; Notable for the following components:   Potassium 3.2 (*)    Glucose, Bld 125 (*)    Calcium 8.4 (*)    All other components within normal limits    CT HEAD WO CONTRAST  Final Result      Medications  diphenhydrAMINE (BENADRYL) injection 25 mg (25 mg Intravenous Given 10/07/18 2021)  prochlorperazine (COMPAZINE) injection 10 mg (10 mg Intravenous Given 10/07/18 2026)  sodium chloride 0.9 % bolus 1,000 mL ( Intravenous Stopped 10/07/18 2120)     Procedures Critical Care  ED Course and Medical Decision Making  I have reviewed the triage vital signs and the nursing notes.  Pertinent labs & imaging results that were available during my care of the patient were reviewed by me and considered in my medical decision making (see below for details).  Patient has a history of vasovagal syncope as well as migraine, both of which could be contributing to her presentation today.  However, given this report of possible facial droop during the syncopal event, this raises the concern for the possibility of TIA.  Will begin with CT Noncon, basic labs, and consider neurology consultation.  Work-up is largely unrevealing, normal CT head, normal labs, EKG is without acute concerns.  Favoring benign syncope, considered to be low risk without indication for further testing or admission.  Patient explains that her headache is completely resolved after migraine cocktail and she feels well.  Continued reassuring neurological exam.  Discussed case with Dr. Lorraine Lax of neurology.  Clinically much more in favor of migraine as the underlying etiology.  Felt to be very unlikely to be a TIA or vascular issue.  Appropriate for close outpatient follow-up and strict return precautions for numbness or weakness.  After the  discussed management above, the patient was determined to be safe for discharge.  The patient was in agreement with this plan and all questions regarding their care were answered.  ED return precautions were discussed and the patient will return to the ED with any significant worsening of condition.  Barth Kirks. Sedonia Small, [place] [email or web address]  Final Clinical Impressions(s) / ED Diagnoses     ICD-10-CM   1. Syncope, unspecified syncope type R55   2. Nonintractable  headache, unspecified chronicity pattern, unspecified headache type R51     ED Discharge Orders         Ordered    prochlorperazine (COMPAZINE) 10 MG tablet  2 times daily PRN     10/07/18 2246             Maudie Flakes, MD 10/07/18 2250

## 2018-10-07 NOTE — ED Notes (Signed)
EKG performed on pt and given to MD.

## 2018-10-10 ENCOUNTER — Other Ambulatory Visit: Payer: Self-pay

## 2018-10-11 ENCOUNTER — Telehealth: Payer: Self-pay | Admitting: Women's Health

## 2018-10-11 ENCOUNTER — Other Ambulatory Visit: Payer: Self-pay | Admitting: Women's Health

## 2018-10-11 ENCOUNTER — Encounter: Payer: BLUE CROSS/BLUE SHIELD | Admitting: Women's Health

## 2018-10-11 MED ORDER — MEDROXYPROGESTERONE ACETATE 10 MG PO TABS: 10.0000 mg | ORAL_TABLET | Freq: Every day | ORAL | 0 refills | Status: DC

## 2018-10-11 MED ORDER — ESTRADIOL 0.5 MG PO TABS: 0.5000 mg | ORAL_TABLET | Freq: Every day | ORAL | 0 refills | Status: DC

## 2018-10-11 MED ORDER — SUMATRIPTAN SUCCINATE 50 MG PO TABS: 50.0000 mg | ORAL_TABLET | ORAL | 0 refills | Status: DC | PRN

## 2018-10-11 NOTE — Telephone Encounter (Signed)
Patient presented to office for annual exam and problem with persistent migraine without aura headache with nausea.  Was seen at the ER had a negative CT scan but was given nothing for the headache.  Has new insurance that does not cover our office and was not able to afford to stay but had several questions.  Interpreter and daughter were present.  Has had nausea due to the headache, stopped HRT 2 months ago and has had numerous hot flushes, poor sleep, increased moodiness and more headaches since stopping.  States felt better when on hormones.  History of migraines without aura for many years.  Denies any other issues at this time regarding vaginal discharge, urinary symptoms, fever, cough, or GI changes.  Appears uncomfortable  Migraine without aura Postmenopausal with menopausal symptoms  Plan: Instructed the daughter to find a neurologist, primary care in the [facility].  Imitrex 50 mg p.o. today repeat in 2 hours if needed no more than 2 tablets in 24 hours.  Continue rest.  Start back on HRT half tablet of estradiol 0.5 and Provera 10 mg day 1 through 12 of each month, reviewed if tolerates only half tablet with relief of symptoms continue with half tablet, reviewed may have to take full tablet of estradiol.  Reviewed less is best, to use shortest amount of time in regards to hormones, the risks of blood clots and strokes reviewed.

## 2018-10-23 ENCOUNTER — Encounter: Payer: BLUE CROSS/BLUE SHIELD | Admitting: Women's Health

## 2018-12-11 ENCOUNTER — Other Ambulatory Visit: Payer: Self-pay

## 2018-12-12 ENCOUNTER — Encounter: Payer: BLUE CROSS/BLUE SHIELD | Admitting: Women's Health

## 2018-12-12 ENCOUNTER — Telehealth: Payer: Self-pay | Admitting: *Deleted

## 2018-12-12 MED ORDER — MEDROXYPROGESTERONE ACETATE 10 MG PO TABS: 10.0000 mg | ORAL_TABLET | Freq: Every day | ORAL | 0 refills | Status: DC

## 2018-12-12 MED ORDER — ESTRADIOL 0.5 MG PO TABS: 0.5000 mg | ORAL_TABLET | Freq: Every day | ORAL | 0 refills | Status: DC

## 2018-12-13 ENCOUNTER — Telehealth: Payer: Self-pay | Admitting: *Deleted

## 2018-12-13 MED ORDER — MEDROXYPROGESTERONE ACETATE 10 MG PO TABS: 10.0000 mg | ORAL_TABLET | Freq: Every day | ORAL | 0 refills | Status: DC

## 2018-12-13 MED ORDER — ESTRADIOL 0.5 MG PO TABS: 0.5000 mg | ORAL_TABLET | Freq: Every day | ORAL | 0 refills | Status: DC

## 2018-12-13 NOTE — Telephone Encounter (Signed)
error 

## 2018-12-13 NOTE — Telephone Encounter (Signed)
-----   Message from Sinclair Grooms sent at 12/13/2018  2:36 PM EDT ----- Regarding: RE: Can you help me Patient informed but pharmacy is [facility] on [address]. Can you please resend? Thx ----- Message ----- From: Enrigue Catena, RMA Sent: 12/13/2018   2:10 PM EDT To: Sinclair Grooms Subject: Can you help me                                Can you call this patient and let her know her medication was sent to her pharmacy [facility].  She speaks spanish it states needs interpretor

## 2019-01-07 ENCOUNTER — Other Ambulatory Visit: Payer: Self-pay | Admitting: Women's Health

## 2019-02-07 ENCOUNTER — Other Ambulatory Visit: Payer: Self-pay | Admitting: Women's Health

## 2019-02-07 DIAGNOSIS — Z1231 Encounter for screening mammogram for malignant neoplasm of breast: Secondary | ICD-10-CM

## 2019-03-28 ENCOUNTER — Other Ambulatory Visit: Payer: Self-pay

## 2019-03-28 ENCOUNTER — Ambulatory Visit: Admission: RE | Admit: 2019-03-28 | Payer: BLUE CROSS/BLUE SHIELD | Source: Ambulatory Visit

## 2019-04-06 ENCOUNTER — Other Ambulatory Visit: Payer: Self-pay | Admitting: Women's Health

## 2019-05-19 IMAGING — CT CT HEAD WITHOUT CONTRAST
3 series · 15 of 47 positions shown, 18 images · non-contrast
Comparison: None.

CLINICAL DATA: Headache, syncope

EXAM:
CT HEAD WITHOUT CONTRAST
TECHNIQUE: Contiguous axial images were obtained from the base of the skull
through the vertex without intravenous contrast.

[Series 2: head wo · axial · 0.40mm/px · z∈[-202,-77]mm · 9 of 31 slices shown, 12 images]
[im 3/31  brain]
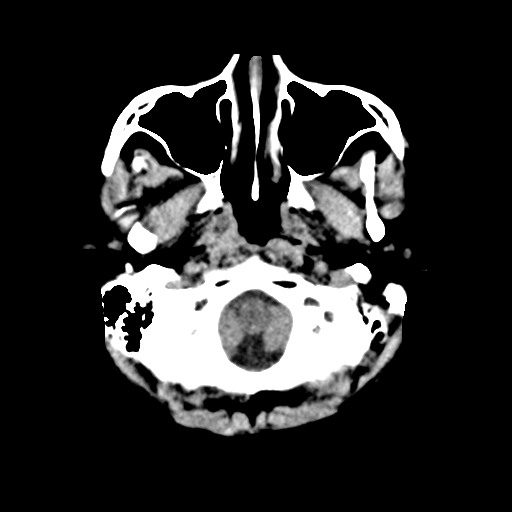
[im 3/31  bone]
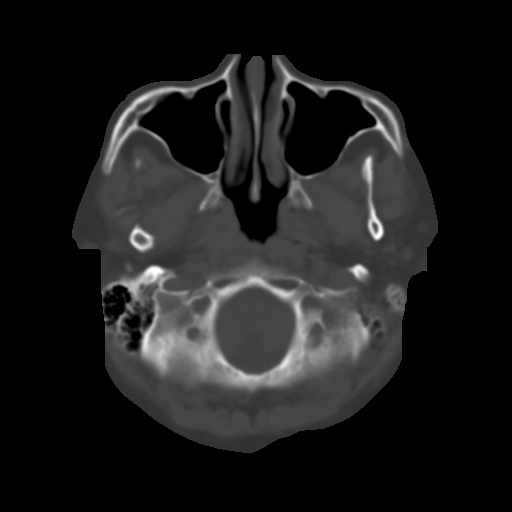
[im 6/31  brain]
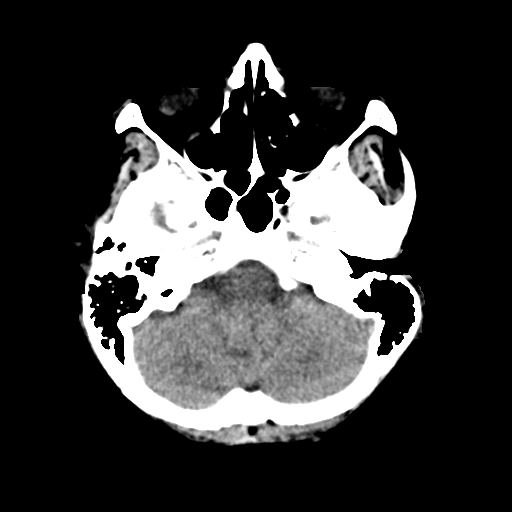
[im 9/31  brain]
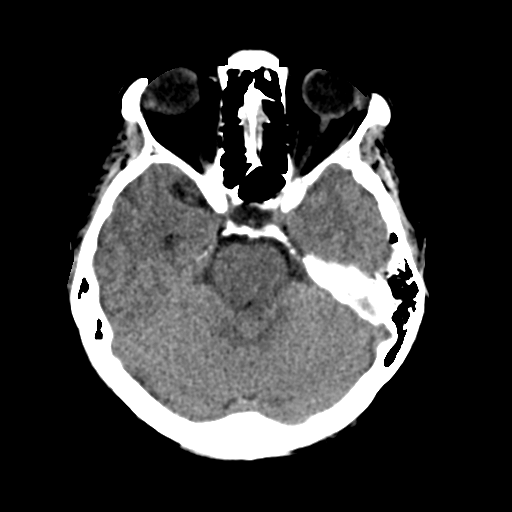
[im 12/31  brain]
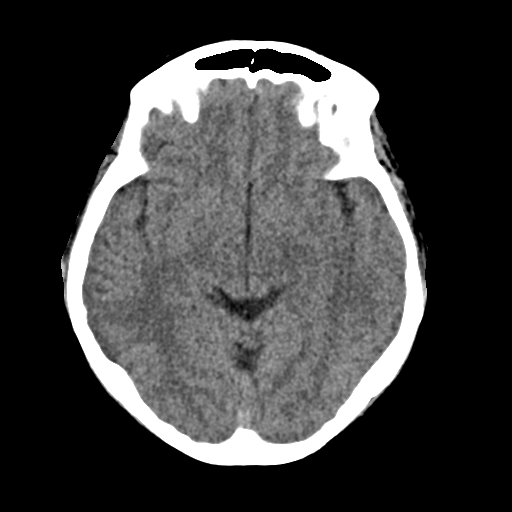
[im 16/31  brain]
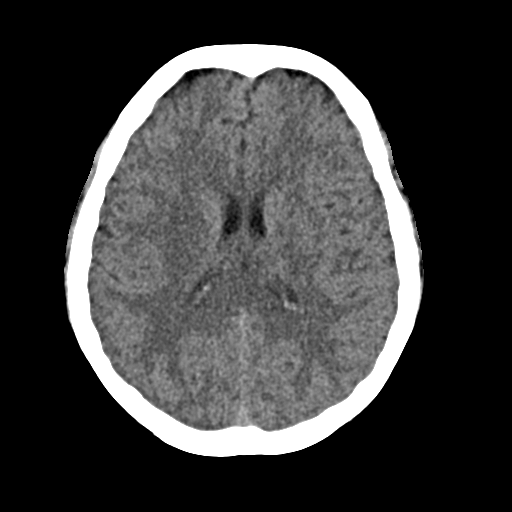
[im 16/31  bone]
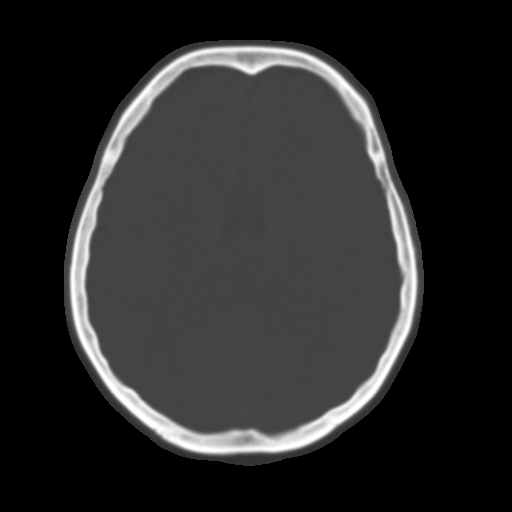
[im 19/31  brain]
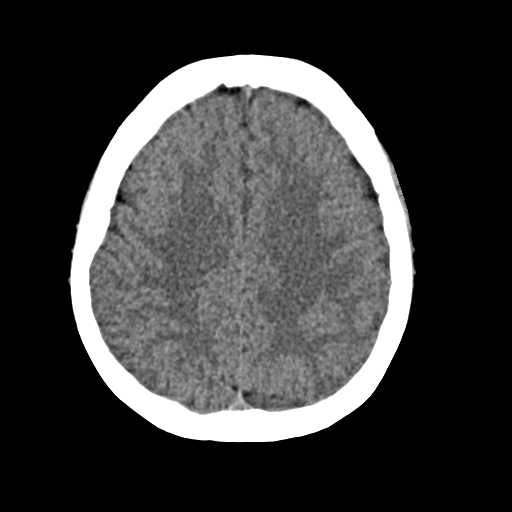
[im 22/31  brain]
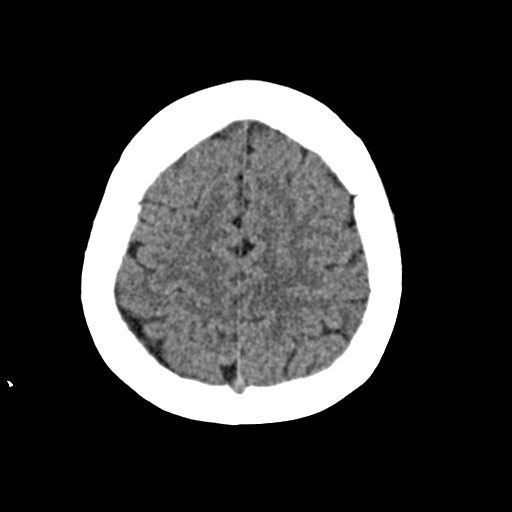
[im 25/31  brain]
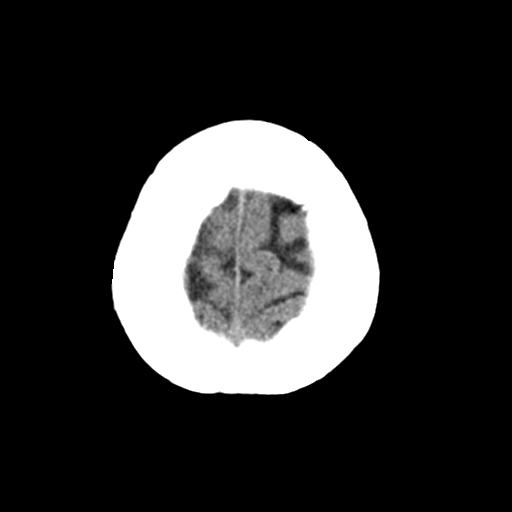
[im 28/31  brain]
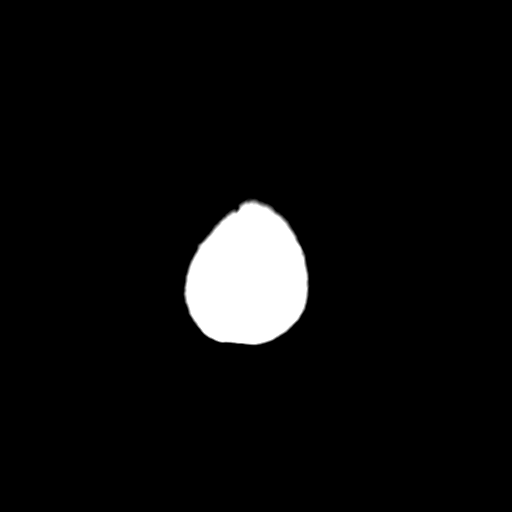
[im 28/31  bone]
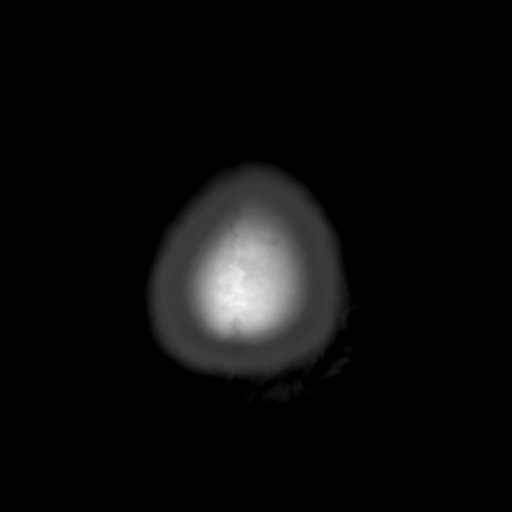

[Series 4: coronal soft · coronal · 0.30mm/px · 3 of 64 slices shown]
[im 22/64  brain]
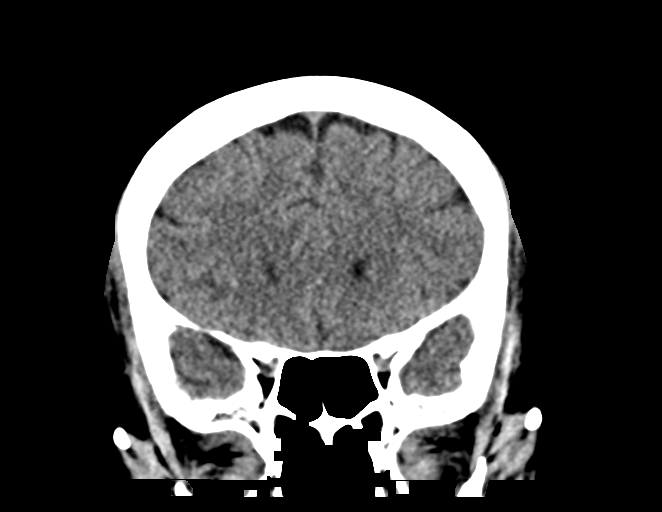
[im 29/64  brain]
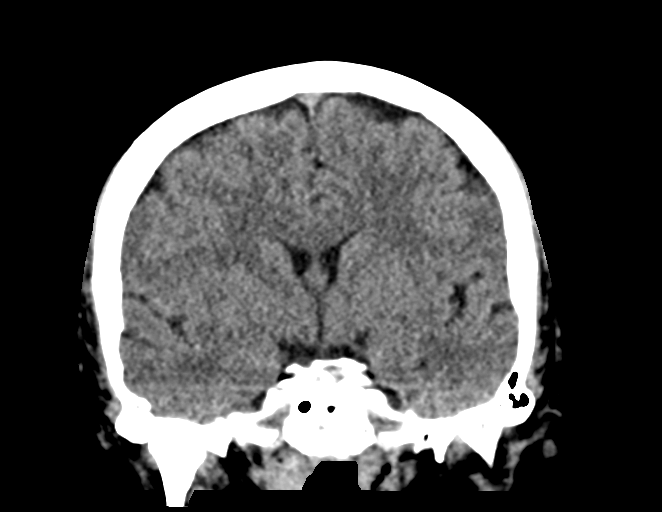
[im 36/64  brain]
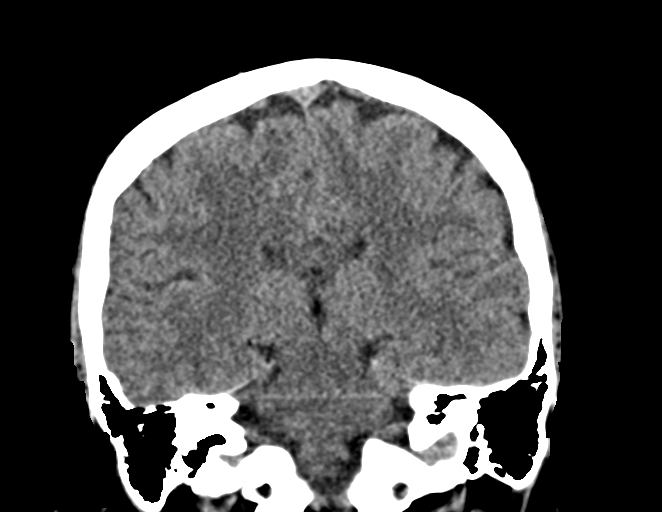

[Series 5: sag soft · sagittal · 0.30mm/px · 3 of 58 slices shown]
[im 20/58  brain]
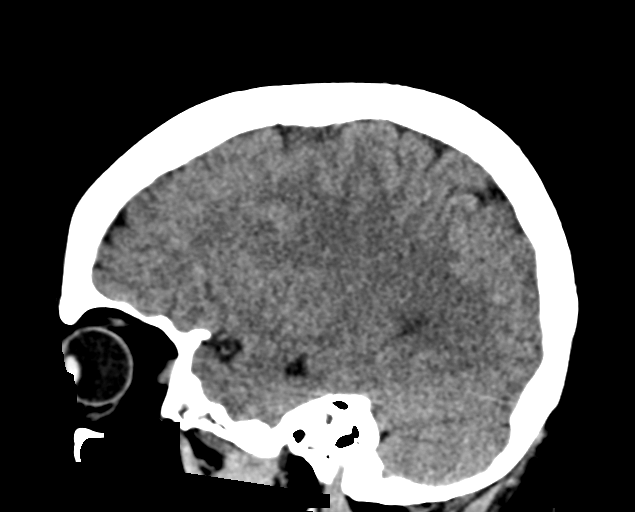
[im 29/58  brain]
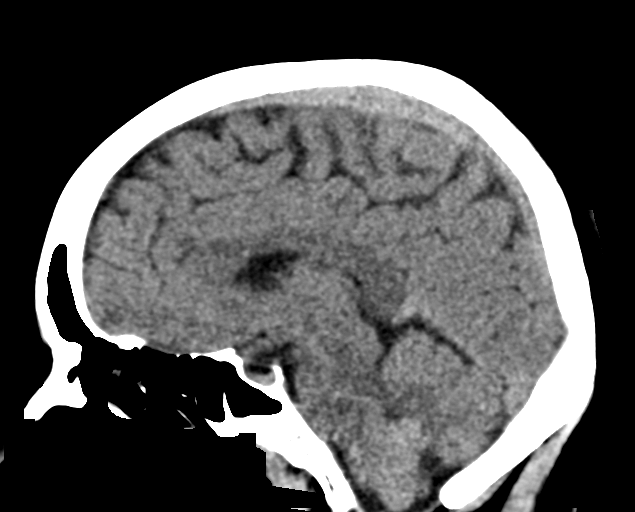
[im 39/58  brain]
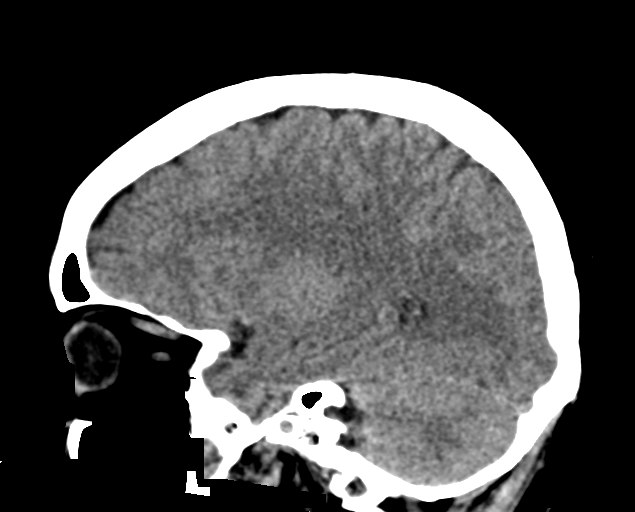

[15 of 47 positions shown; findings below may reference images not displayed]

FINDINGS: Brain: No evidence of acute infarction, hemorrhage, hydrocephalus,
extra-axial collection or mass lesion/mass effect.

Vascular: No hyperdense vessel or unexpected calcification.

Skull: Normal. Negative for fracture or focal lesion.

Sinuses/Orbits: No acute finding.

Other: None.
IMPRESSION: No acute intracranial pathology. No non-contrast CT findings to
explain headache.

## 2019-11-30 ENCOUNTER — Other Ambulatory Visit: Payer: Self-pay

## 2019-11-30 ENCOUNTER — Encounter (HOSPITAL_BASED_OUTPATIENT_CLINIC_OR_DEPARTMENT_OTHER): Payer: Self-pay | Admitting: *Deleted

## 2019-11-30 DIAGNOSIS — R519 Headache, unspecified: Secondary | ICD-10-CM | POA: Diagnosis not present

## 2019-11-30 DIAGNOSIS — Z79899 Other long term (current) drug therapy: Secondary | ICD-10-CM | POA: Diagnosis not present

## 2019-11-30 NOTE — ED Triage Notes (Signed)
Pt nausea x 1 week , dizziness x 1 day

## 2019-12-01 ENCOUNTER — Emergency Department (HOSPITAL_BASED_OUTPATIENT_CLINIC_OR_DEPARTMENT_OTHER)
Admission: EM | Admit: 2019-12-01 | Discharge: 2019-12-01 | Disposition: A | Discharge status: Home / Self Care | Payer: 59 | Attending: Emergency Medicine | Admitting: Emergency Medicine

## 2019-12-01 DIAGNOSIS — R519 Headache, unspecified: Secondary | ICD-10-CM

## 2019-12-01 MED ORDER — KETOROLAC TROMETHAMINE 15 MG/ML IJ SOLN
15.0000 mg | Freq: Once | INTRAMUSCULAR | Status: AC
  Administered 2019-12-01: 15 mg via INTRAVENOUS
  Filled 2019-12-01: qty 1

## 2019-12-01 MED ORDER — PROCHLORPERAZINE EDISYLATE 10 MG/2ML IJ SOLN
10.0000 mg | Freq: Once | INTRAMUSCULAR | Status: AC
  Administered 2019-12-01: 10 mg via INTRAVENOUS
  Filled 2019-12-01: qty 2

## 2019-12-01 MED ORDER — DIPHENHYDRAMINE HCL 50 MG/ML IJ SOLN
25.0000 mg | Freq: Once | INTRAMUSCULAR | Status: AC
  Administered 2019-12-01: 25 mg via INTRAVENOUS
  Filled 2019-12-01: qty 1

## 2019-12-01 MED ORDER — SODIUM CHLORIDE 0.9% FLUSH
3.0000 mL | Freq: Once | INTRAVENOUS | Status: DC
  Filled 2019-12-01: qty 3

## 2019-12-01 MED ORDER — DEXAMETHASONE SODIUM PHOSPHATE 10 MG/ML IJ SOLN
10.0000 mg | Freq: Once | INTRAMUSCULAR | Status: AC
  Administered 2019-12-01: 10 mg via INTRAVENOUS
  Filled 2019-12-01: qty 1

## 2019-12-01 NOTE — ED Provider Notes (Signed)
[facility] EMERGENCY DEPARTMENT Provider Note  CSN: [id number] Arrival date & time: 11/30/19 2336  Chief Complaint(s) Headache  HPI Caitlin Dunlap is a 54 y.o. female   HPI CC: headache  Onset/Duration: 1 week Timing: constant Location: migrate from frontal to occiput Quality: aching Severity: moderate to severe Modifying Factors:  Improved by: dark room and ice  Worsened by: nothing Associated Signs/Symptoms:  Pertinent (+): dizziness (vertigo)  Pertinent (-): fever, congestion, vision changes, focal deficits Context: similar to prior migraines. Not responding to exedrine  Past Medical History Past Medical History:  Diagnosis Date  . Anemia   . Hashimoto's thyroiditis   . Migraine   . Osteoarthritis    as a child   Patient Active Problem List   Diagnosis Date Noted  . Varicose veins of bilateral lower extremities with other complications [id number]  . Spider veins of both lower extremities 10/05/2016  . Vasovagal syncope 01/03/2014  . Family history of colon cancer 05/31/2012  . Weight gain 12/20/2011  . Acanthosis nigricans 12/01/2011  . Melasma 12/01/2011  . Thyroid disorder 11/16/2011  . FACIAL RASH 09/29/2009  . ANEMIA OF OTHER CHRONIC DISEASE 09/08/2009  . ANEMIA-NOS 09/08/2009  . COMMON MIGRAINE 09/08/2009  . ALLERGIC RHINITIS 09/08/2009  . MELASMA 09/08/2009  . OSTEOARTHRITIS 09/08/2009  . HEADACHE 09/08/2009   Home Medication(s) Prior to Admission medications   Medication Sig Start Date End Date Taking? Authorizing Provider  ammonium lactate (LAC-HYDRIN) 12 % lotion Apply 1 application topically as needed for dry skin. 08/17/18   Hyatt, Max T, DPM  estradiol (ESTRACE) 0.5 MG tablet Take 1 tablet (0.5 mg total) by mouth daily. Can try half tablet 12/13/18   Huel Cote, NP  medroxyPROGESTERone (PROVERA) 10 MG tablet Take 1 tablet (10 mg total) by mouth daily. Day 1-12 of each month 12/13/18   Huel Cote, NP  methylPREDNISolone (MEDROL  DOSEPAK) 4 MG TBPK tablet 6 day dose pack - take as directed 08/17/18   Hyatt, Max T, DPM  ondansetron (ZOFRAN) 4 MG tablet Take 1 tablet (4 mg total) by mouth every 8 (eight) hours as needed for nausea or vomiting. 07/03/18   Hyatt, Max T, DPM  prochlorperazine (COMPAZINE) 10 MG tablet Take 1 tablet (10 mg total) by mouth 2 (two) times daily as needed for nausea or vomiting. 10/07/18   Maudie Flakes, MD  promethazine (PHENERGAN) 25 MG tablet Take 1 tablet (25 mg total) by mouth every 8 (eight) hours as needed for nausea or vomiting. 07/13/18   Evelina Bucy, DPM  SUMAtriptan (IMITREX) 50 MG tablet Take 1 tablet (50 mg total) by mouth every 2 (two) hours as needed for migraine. May repeat in 2 hours if headache persists or recurs. 10/11/18   Huel Cote, NP  Past Surgical History Past Surgical History:  Procedure Laterality Date  . INTRAUTERINE DEVICE INSERTION  5/13   Paragard  . LIPOMA RESECTION     FROM RIGHT UNDERARM  . VARICOSE VEIN SURGERY  2004   Family History Family History  Problem Relation Age of Onset  . Diabetes Maternal Aunt     Social History Social History   Tobacco Use  . Smoking status: Never Smoker  . Smokeless tobacco: Never Used  Substance Use Topics  . Alcohol use: No  . Drug use: No   Allergies Patient has no known allergies.  Review of Systems Review of Systems All other systems are reviewed and are negative for acute change except as noted in the HPI  Physical Exam Vital Signs  I have reviewed the triage vital signs BP (!) 158/78 (BP Location: Right Arm)   Pulse 60   Temp 98.5 F (36.9 C) (Oral)   Resp 13   Ht 5\' 5"  (1.651 m)   SpO2 100%   BMI 28.29 kg/m   Physical Exam Vitals reviewed.  Constitutional:      General: She is not in acute distress.    Appearance: She is well-developed. She is not diaphoretic.    HENT:     Head: Normocephalic and atraumatic.     Right Ear: External ear normal.     Left Ear: External ear normal.     Nose: Nose normal.  Eyes:     General: No scleral icterus.    Conjunctiva/sclera: Conjunctivae normal.  Neck:     Trachea: Phonation normal.   Cardiovascular:     Rate and Rhythm: Normal rate and regular rhythm.  Pulmonary:     Effort: Pulmonary effort is normal. No respiratory distress.     Breath sounds: No stridor.  Abdominal:     General: There is no distension.  Musculoskeletal:        General: Normal range of motion.     Cervical back: Normal range of motion. Muscular tenderness present. No spinous process tenderness.  Neurological:     Mental Status: She is alert and oriented to person, place, and time.     Comments: Mental Status:  Alert and oriented to person, place, and time.  Attention and concentration normal.  Speech clear.  Recent memory is intact  Cranial Nerves:  II Visual Fields: Intact to confrontation. Visual fields intact. III, IV, VI: Pupils equal and reactive to light and near. Full eye movement without nystagmus  V Facial Sensation: Normal. No weakness of masticatory muscles  VII: No facial weakness or asymmetry  VIII Auditory Acuity: Grossly normal  IX/X: The uvula is midline; the palate elevates symmetrically  XI: Normal sternocleidomastoid and trapezius strength  XII: The tongue is midline. No atrophy or fasciculations.   Motor System: Muscle Strength: 5/5 and symmetric in the upper and lower extremities. No pronation or drift.  Muscle Tone: Tone and muscle bulk are normal in the upper and lower extremities.   Reflexes: DTRs: 1+ and symmetrical in all four extremities. No Clonus Coordination:  No tremor.  Sensation: Intact to light touch, and pinprick. .  Gait: Routine gait normal.   Psychiatric:        Behavior: Behavior normal.     ED Results and Treatments Labs (all labs ordered are listed, but only abnormal  results are displayed) Labs Reviewed - No data to display  EKG  EKG Interpretation  Date/Time:  Saturday Dec 01 2019 01:50:09 EDT Ventricular Rate:  65 PR Interval:    QRS Duration: 91 QT Interval:  407 QTC Calculation: 424 R Axis:   40 Text Interpretation: Sinus rhythm Low voltage, precordial leads Baseline wander Otherwise no significant change Confirmed by Addison Lank [phone]) on 12/01/2019 1:57:09 AM      Radiology No results found.  Pertinent labs & imaging results that were available during my care of the patient were reviewed by me and considered in my medical decision making (see chart for details).  Medications Ordered in ED Medications  diphenhydrAMINE (BENADRYL) injection 25 mg (25 mg Intravenous Given 12/01/19 0335)  dexamethasone (DECADRON) injection 10 mg (10 mg Intravenous Given 12/01/19 0337)  prochlorperazine (COMPAZINE) injection 10 mg (10 mg Intravenous Given 12/01/19 0337)  ketorolac (TORADOL) 15 MG/ML injection 15 mg (15 mg Intravenous Given 12/01/19 [id number])                                                                                                                                    Procedures Procedures  (including critical care time)  Medical Decision Making / ED Course I have reviewed the nursing notes for this encounter and the patient's prior records (if available in EHR or on provided paperwork).   Caitlin Dunlap was evaluated in Emergency Department on 12/01/2019 for the symptoms described in the history of present illness. She was evaluated in the context of the global COVID-19 pandemic, which necessitated consideration that the patient might be at risk for infection with the SARS-CoV-2 virus that causes COVID-19. Institutional protocols and algorithms that pertain to the evaluation of patients at risk for COVID-19 are in a state of rapid  change based on information released by regulatory bodies including the CDC and federal and state organizations. These policies and algorithms were followed during the patient's care in the ED.  Typical migraine headache for the pt. Non focal neuro exam. No recent head trauma. No fever. Doubt meningitis. Doubt intracranial bleed. Doubt IIH. No indication for imaging. Will treat with migraine cocktail and reevaluate.  4:58 AM HA Resolved.      Final Clinical Impression(s) / ED Diagnoses Final diagnoses:  Bad headache    The patient appears reasonably screened and/or stabilized for discharge and I doubt any other medical condition or other [facility] requiring further screening, evaluation, or treatment in the ED at this time prior to discharge. Safe for discharge with strict return precautions.  Disposition: Discharge  Condition: Good  I have discussed the results, Dx and Tx plan with the patient/family who expressed understanding and agree(s) with the plan. Discharge instructions discussed at length. The patient/family was given strict return precautions who verbalized understanding of the instructions. No further questions at time of discharge.    ED Discharge Orders    None      Follow Up: Huel Cote, NP [place]  [address] [place] [address] [phone]  Schedule an appointment as soon as possible for a visit  As needed     This chart was dictated using voice recognition software.  Despite best efforts to proofread,  errors can occur which can change the documentation meaning.   Fatima Blank, MD 12/01/19 [phone]

## 2020-10-27 NOTE — Progress Notes (Signed)
55 y.o. G69P3003 Married Other or two or more races female here for annual exam.    Period Pattern:  (has paragard iud & no cycle in maybe 37mhs) No LMP recorded. Pt thinks late summer or fall 2021           Is taking 0.5 mg estradiol for hot flashes. If she misses a dose feels horrible, even 1 day. Gets headaches. Taking Provera 129mx 12 days of the month but does not have bleeding, no need to cycle at this time.  Sexually active: Yes.    The current method of family planning is IUD.   paragard inserted 11-22-2011 Exercising: Yes.    walking Smoker:  no  Health Maintenance: Pap:  08-06-2016 neg HPV HR neg, per patient had 1 done in 2020 at another office (normal) History of abnormal Pap:  no MMG:  04-23-2019 bilateral & 07-11-2019 left breast & left breast u/s birads 2:neg Colonoscopy:  2014 normal per patient BMD:   none Gardasil:   n/a Covid-19: pfizer Hep C testing: not done Screening Labs: wants to have done here, does not have PCP   reports that she has never smoked. She has never used smokeless tobacco. She reports that she does not drink alcohol and does not use drugs.  Past Medical History:  Diagnosis Date  . Anemia   . Hashimoto's thyroiditis   . Migraine   . Osteoarthritis    as a child    Past Surgical History:  Procedure Laterality Date  . INTRAUTERINE DEVICE INSERTION  5/13   Paragard  . LIPOMA RESECTION     FROM RIGHT UNDERARM  . VARICOSE VEIN SURGERY  2004    Current Outpatient Medications  Medication Sig Dispense Refill  . estradiol (ESTRACE) 0.5 MG tablet Take 1 tablet (0.5 mg total) by mouth daily. Can try half tablet 90 tablet 0  . medroxyPROGESTERone (PROVERA) 10 MG tablet Take 1 tablet (10 mg total) by mouth daily. Day 1-12 of each month 36 tablet 0  . Omega-3 Fatty Acids (OMEGA 3 PO) Take by mouth.    . [facility] KitchenIIDRA 5 % SOLN [facility]IL [facility] TWO TIMES A DAY AS DIRECTED     No current facility-administered medications for this visit.    Family  History  Problem Relation Age of Onset  . Diabetes Maternal Aunt     Review of Systems  Constitutional: Negative.   HENT: Negative.   Eyes: Negative.   Respiratory: Negative.   Cardiovascular: Negative.   Gastrointestinal: Negative.   Endocrine: Negative.   Genitourinary: Negative.   Musculoskeletal: Negative.   Skin: Negative.   Allergic/Immunologic: Negative.   Neurological: Negative.   Hematological: Negative.   Psychiatric/Behavioral: Negative.     Exam:   BP 120/78   Pulse 70   Resp 16   Ht _0  (1.626 m)   Wt 159 lb (72.1 kg)   BMI 27.29 kg/m   Height: _1  (162.6 cm)  General appearance: alert, cooperative and appears stated age, no acute distress Head: Normocephalic, without obvious abnormality Neck: no adenopathy, thyroid normal to inspection and palpation Lungs: clear to auscultation bilaterally Breasts: No axillary or supraclavicular adenopathy, Normal to palpation without dominant masses, dense tissue upper outer quadrant of left breast, pt reports has been evaluated with [place], pt states no change Heart: regular rate and rhythm Abdomen: soft, non-tender; no masses,  no organomegaly Extremities: extremities normal, no edema Skin: No rashes or lesions Lymph nodes: Cervical, supraclavicular, and axillary nodes  normal. No abnormal inguinal nodes palpated Neurologic: Grossly normal   Pelvic: External genitalia:  no lesions              Urethra:  normal appearing urethra with no masses, tenderness or lesions              Bartholins and Skenes: normal                 Vagina: normal appearing vagina, appropriate for age, normal appearing discharge, no lesions              Cervix: neg cervical motion tenderness, no visible lesions, Paragard string visible             Bimanual Exam:   Uterus:  normal size, contour, position, consistency, mobility, non-tender              Adnexa: no mass, fullness, tenderness                 Joy, CMA Chaperone was  present for exam.  A:  Well woman exam with routine gynecological exam   Plan: MM 3D SCREEN BREAST BILATERAL  Menopausal symptoms - Plan: progesterone (PROMETRIUM) 100 MG capsule, estradiol (ESTRACE) 1 MG tablet  Encounter for screening for lipid disorder - Plan: Lipid Profile  Screening for diabetes mellitus - Plan: Comp Met (CMET), Hemoglobin A1c  Thyroid disorder screen - Plan: TSH (Hx Hashimotos's thyroiditis)  Screening for deficiency anemia - Plan: CBC  Surveillance of intrauterine contraceptive device- will plan removal next year if 12 months with out menses  P:   Pap : due 2024  Mammogram: ordered per pt request, pt to call, phone number provided. Emphasized importance mammogram every year with hormone therapy  Colonoscopy due 2024 Recommend dermatology referral for new mole on right breast. List of area dermatologist provided.  Willette Pa Interpretor

## 2020-10-28 ENCOUNTER — Other Ambulatory Visit: Payer: Self-pay

## 2020-10-28 ENCOUNTER — Encounter: Payer: Self-pay | Admitting: Nurse Practitioner

## 2020-10-28 ENCOUNTER — Ambulatory Visit (INDEPENDENT_AMBULATORY_CARE_PROVIDER_SITE_OTHER): Payer: 59 | Admitting: Nurse Practitioner

## 2020-10-28 VITALS — BP 120/78 | HR 70 | Resp 16 | Ht 64.0 in | Wt 159.0 lb

## 2020-10-28 DIAGNOSIS — Z30431 Encounter for routine checking of intrauterine contraceptive device: Secondary | ICD-10-CM

## 2020-10-28 DIAGNOSIS — Z131 Encounter for screening for diabetes mellitus: Secondary | ICD-10-CM | POA: Diagnosis not present

## 2020-10-28 DIAGNOSIS — Z13220 Encounter for screening for lipoid disorders: Secondary | ICD-10-CM

## 2020-10-28 DIAGNOSIS — Z01419 Encounter for gynecological examination (general) (routine) without abnormal findings: Secondary | ICD-10-CM

## 2020-10-28 DIAGNOSIS — N951 Menopausal and female climacteric states: Secondary | ICD-10-CM | POA: Diagnosis not present

## 2020-10-28 DIAGNOSIS — Z130 Encounter for screening for diseases of the blood and blood-forming organs and certain disorders involving the immune mechanism: Secondary | ICD-10-CM

## 2020-10-28 DIAGNOSIS — Z1329 Encounter for screening for other suspected endocrine disorder: Secondary | ICD-10-CM

## 2020-10-28 MED ORDER — ESTRADIOL 1 MG PO TABS: 1.0000 mg | ORAL_TABLET | Freq: Every day | ORAL | 12 refills | Status: AC

## 2020-10-28 MED ORDER — PROGESTERONE MICRONIZED 100 MG PO CAPS: 100.0000 mg | ORAL_CAPSULE | Freq: Every day | ORAL | 12 refills | Status: AC

## 2020-10-28 NOTE — Patient Instructions (Signed)
Shirley Friar Coupon 2022: Up to 80% Discount - [email or web address] > prescription > xiidra The retail price of a one-month supply of Xiidra without insurance is [phone] for 60, 5% Solution. Try a free SingleCare savings card and pay a discounted price ...  Please look into cheaper prices for your eye drops, ask pharmacist for cheaper options. Look into other options such as Good Rx.   Mantenimiento de Technical sales engineer en [place] Maintenance, Female Adoptar un estilo de vida saludable y recibir atencin preventiva son importantes para promover la salud y Musician. Consulte al mdico sobre:  El esquema adecuado para hacerse pruebas y exmenes peridicos.  Cosas que puede hacer por su cuenta para prevenir enfermedades y [employer]. Qu debo saber sobre la dieta, el peso y el ejercicio? Consuma una dieta saludable  Consuma una dieta que incluya muchas verduras, frutas, productos lcteos con bajo contenido de [place] y Advertising account planner.  No consuma muchos alimentos ricos en grasas slidas, azcares agregados o sodio.   Mantenga un peso saludable El ndice de masa muscular [facility]) se [place] para identificar problemas de [place]. Proporciona una estimacin de la grasa corporal basndose en el peso y la altura. Su mdico puede ayudarle a Radiation protection practitioner [place] y a Scientist, forensic o Theatre manager un peso saludable. Haga ejercicio con regularidad Haga ejercicio con regularidad. Esta es una de las prcticas ms importantes que puede hacer por su salud. La mayora de los adultos deben seguir estas pautas:  Optometrist, al menos, 167minutos de actividad fsica por semana. El ejercicio debe aumentar la frecuencia cardaca y Nature conservation officer transpirar (ejercicio de intensidad moderada).  Hacer ejercicios de fortalecimiento por lo [employer] por semana. Agregue esto a su plan de ejercicio de intensidad moderada.  Pasar menos tiempo sentados. Incluso la actividad fsica ligera puede ser beneficiosa. Controle  sus niveles de colesterol y lpidos en la sangre Comience a realizarse anlisis de lpidos y Research officer, trade union en la sangre a los 20aos y luego reptalos cada 5aos. Hgase controlar los niveles de colesterol con mayor frecuencia si:  Sus niveles de lpidos y colesterol son altos.  Es mayor de 40aos.  Presenta un alto riesgo de padecer enfermedades cardacas. Qu debo saber sobre las pruebas de deteccin del cncer? Segn su historia clnica y sus antecedentes familiares, es posible que deba realizarse pruebas de deteccin del cncer en diferentes edades. Esto puede incluir pruebas de deteccin de lo siguiente:  Cncer de mama.  Cncer de cuello uterino.  Cncer colorrectal.  Cncer de piel.  Cncer de pulmn. Qu debo saber sobre la enfermedad cardaca, la diabetes y la hipertensin arterial? Presin arterial y enfermedad cardaca  La hipertensin arterial causa enfermedades cardacas y [place] el riesgo de accidente cerebrovascular. Es ms probable que esto se manifieste en las personas que tienen lecturas de presin arterial alta, tienen ascendencia africana o tienen sobrepeso.  Hgase controlar la presin arterial: ? Cada 3 a 5 aos si tiene entre 18 y 68 aos. ? Todos los aos si es mayor de [place]. Diabetes Realcese exmenes de deteccin de la diabetes con regularidad. Este anlisis revisa el nivel de azcar en la sangre en [place]. Hgase las pruebas de deteccin:  Cada tresaos despus de los 61aos de edad si tiene un peso normal y un bajo riesgo de padecer diabetes.  Con ms frecuencia y a partir de [place] edad inferior si tiene sobrepeso o un alto riesgo de padecer diabetes. Qu debo saber sobre la prevencin de infecciones? Hepatitis B Si tiene un riesgo ms  alto de contraer hepatitis B, debe someterse a un examen de deteccin de este virus. Hable con el mdico para averiguar si tiene riesgo de contraer la infeccin por hepatitis B. Hepatitis C Se recomienda el  anlisis a:  [employer] 1944 y 1965.  Todas las personas que tengan un riesgo de haber contrado hepatitis C. Enfermedades de transmisin sexual (ETS)  Hgase las pruebas de Programme researcher, broadcasting/film/video de ITS, incluidas la gonorrea y la clamidia, si: ? Es sexualmente activa y es menor de [place]. ? Es mayor de 24aos, y Investment banker, operational informa que corre riesgo de tener este tipo de infecciones. ? La actividad sexual ha cambiado desde que le hicieron la ltima prueba de deteccin y tiene un riesgo mayor de Best boy clamidia o Radio broadcast assistant. Pregntele al mdico si usted tiene riesgo.  Pregntele al mdico si usted tiene un alto riesgo de Museum/gallery curator VIH. El mdico tambin puede recomendarle un medicamento recetado para ayudar a evitar la infeccin por el VIH. Si elige tomar medicamentos para prevenir el VIH, primero debe [employer] de deteccin del VIH. Luego debe hacerse anlisis cada 41meses mientras est tomando los medicamentos. Embarazo  Si est por dejar de Librarian, academic (fase premenopusica) y usted puede quedar [place], busque asesoramiento antes de [place].  Tome de 400 a [id number] (mcg) de cido [employer] si [place].  Pida mtodos de control de la natalidad (anticonceptivos) si desea evitar un embarazo no deseado. Osteoporosis y [place] La osteoporosis es una enfermedad en la que los huesos pierden los minerales y la fuerza por el avance de la edad. El resultado pueden ser fracturas en los [place]. Si tiene 65aos o ms, o si est en riesgo de sufrir osteoporosis y fracturas, pregunte a su mdico si debe:  Hacerse pruebas de deteccin de prdida sea.  Tomar un suplemento de calcio o de vitamina D para reducir el riesgo de fracturas.  Recibir terapia de reemplazo hormonal (TRH) para tratar los sntomas de la menopausia. Siga estas instrucciones en su casa: Estilo de vida  No consuma ningn producto que contenga nicotina o tabaco, como  cigarrillos, cigarrillos electrnicos y tabaco de Higher education careers adviser. Si necesita ayuda para dejar de fumar, consulte al mdico.  No consuma drogas.  No comparta agujas.  Solicite ayuda a su mdico si necesita apoyo o informacin para abandonar las drogas. Consumo de alcohol  No beba alcohol si: ? Su mdico le indica no hacerlo. ? Est embarazada, puede estar embarazada o est tratando de quedar embarazada.  Si bebe alcohol: ? Limite la cantidad que consume de 0 a 1 medida por da. ? Limite la ingesta si est amamantando.  Est atento a la cantidad de alcohol que hay en las bebidas que toma. En los [place], una medida equivale a una botella de cerveza de 12oz (374ml), un vaso de vino de 5oz (128ml) o un vaso de una bebida alcohlica de alta graduacin de 1oz (57ml). Instrucciones generales  Realcese los estudios de rutina de la salud, dentales y de Public librarian.  [place].  Infrmele a su mdico si: ? Se siente deprimida con frecuencia. ? Alguna vez ha sido vctima de [place] o no se siente segura en su casa. Resumen  Adoptar un estilo de vida saludable y recibir atencin preventiva son importantes para promover la salud y Musician.  Siga las instrucciones del mdico acerca de una dieta saludable, el ejercicio y la realizacin de pruebas o exmenes para Hydrographic surveyor  enfermedades.  Siga las instrucciones del mdico con respecto al control del colesterol y la presin arterial. Esta informacin no tiene Marine scientist el consejo del mdico. Asegrese de hacerle al mdico cualquier pregunta que tenga. Document Revised: 07/19/2018 Document Reviewed: 07/19/2018 Elsevier Patient Education  [place].

## 2020-10-29 LAB — HEMOGLOBIN A1C
Hgb A1c MFr Bld: 5.4 % of total Hgb (ref <5.7)
Mean Plasma Glucose: 108 mg/dL
eAG (mmol/L): 6 mmol/L

## 2020-10-29 LAB — COMPREHENSIVE METABOLIC PANEL
AG Ratio: 1.4 (calc) (ref 1.0–2.5)
ALT: 23 U/L (ref 6–29)
AST: 15 U/L (ref 10–35)
Albumin: 4.4 g/dL (ref 3.6–5.1)
Alkaline phosphatase (APISO): 73 U/L (ref 37–153)
BUN: 12 mg/dL (ref 7–25)
CO2: 33 mmol/L — ABNORMAL HIGH (ref 20–32)
Calcium: 9.8 mg/dL (ref 8.6–10.4)
Chloride: 100 mmol/L (ref 98–110)
Creat: 0.53 mg/dL (ref 0.50–1.05)
Globulin: 3.2 g/dL (calc) (ref 1.9–3.7)
Glucose, Bld: 93 mg/dL (ref 65–99)
Potassium: 3.7 mmol/L (ref 3.5–5.3)
Sodium: 139 mmol/L (ref 135–146)
Total Bilirubin: 0.5 mg/dL (ref 0.2–1.2)
Total Protein: 7.6 g/dL (ref 6.1–8.1)

## 2020-10-29 LAB — LIPID PANEL
Cholesterol: 241 mg/dL — ABNORMAL HIGH (ref <200)
HDL: 40 mg/dL — ABNORMAL LOW (ref >=50)
LDL Cholesterol (Calc): 163 mg/dL (calc) — ABNORMAL HIGH
Non-HDL Cholesterol (Calc): 201 mg/dL (calc) — ABNORMAL HIGH (ref <130)
Total CHOL/HDL Ratio: 6 (calc) — ABNORMAL HIGH (ref <5.0)
Triglycerides: 225 mg/dL — ABNORMAL HIGH (ref <150)

## 2020-10-29 LAB — CBC
HCT: 39.2 % (ref 35.0–45.0)
Hemoglobin: 13 g/dL (ref 11.7–15.5)
MCH: 29.4 pg (ref 27.0–33.0)
MCHC: 33.2 g/dL (ref 32.0–36.0)
MCV: 88.7 fL (ref 80.0–100.0)
MPV: 10.1 fL (ref 7.5–12.5)
Platelets: 292 10*3/uL (ref 140–400)
RBC: 4.42 10*6/uL (ref 3.80–5.10)
RDW: 12.9 % (ref 11.0–15.0)
WBC: 7.1 10*3/uL (ref 3.8–10.8)

## 2020-10-29 LAB — TSH: TSH: 1.92 mIU/L

## 2020-10-30 NOTE — Progress Notes (Signed)
Please route to Spanish Language Pool to notify patient of results:  Caitlin Dunlap- Your Cholesterol panel came back abnormally high and significantly higher than it has been in the past. It is time for you to establish with a primary care because when it comes to your heart health, you need specific attention that can not be managed by gynecology.  Based on your cholesterol results and other health factors, you have a 6.5 % risk of having a cardiovascular event (such as a heart attack or stoke) in the next 10 years. This is considered "borderline" risk and you may be a candidate for medication management to reduce cholesterol.  Please consider:  1. Exercise 150 min moderate exercise per week 2. Eat many fruits and vegetables and increase fiber 3. Minimize fatty foods, fried foods, meats (except lean ONLY, and salmon fish is good too) 4. When you cook with oil, use olive oil 5. Minimize processed foods, try to cook at home and minimize foods that come already prepared or in a package. Whole foods are healthiest. 6. Please make an appointment with a primary care provider for further evaluation, you will want to be fasting for your next blood draw for the most accuracy as possible. 7. You are on estrogen therapy, as we have always known about estrogen in oral form comes with a slight increase in stroke. It is a safer alternative to use a patch to deliver your estrogen. The down side to the patch is that it is generally more expensive than a pill. I still would recommend if you feel like it is affordable. Insurance does not always cover it that well. Please let me know if you are interested in giving this a try.

## 2020-12-10 ENCOUNTER — Ambulatory Visit: Payer: 59 | Admitting: Emergency Medicine

## 2021-04-23 ENCOUNTER — Encounter: Payer: Self-pay | Admitting: Nurse Practitioner

## 2021-07-28 ENCOUNTER — Ambulatory Visit: Payer: Self-pay | Admitting: Emergency Medicine

## 2021-10-29 ENCOUNTER — Encounter: Payer: PRIVATE HEALTH INSURANCE | Admitting: Nurse Practitioner

## 2021-10-29 NOTE — Progress Notes (Deleted)
? ?  Caitlin Dunlap [birth year] [id number] ? ? ?History:  56 y.o. G3P3003 presents for annual exam. Paragard IUD 11/2011. On HRT, has missed doses and felt horrible. Normal pap history. Hyperlipidemia, it was recommended she establish with PCP last year.  ? ?Gynecologic History ?No LMP recorded. (Menstrual status: IUD). ?  ?Contraception/Family planning: IUD and post menopausal status ?Sexually active: Yes ? ?Health Maintenance ?Last Pap: 08/06/2016. Results were: Normal ?Last mammogram: 04/23/2021. Results were: Normal ?Last colonoscopy: 08/08/2012. Results were: Normal, 10-year recall ?Last Dexa: Not indicated ? ?Past medical history, past surgical history, family history and social history were all reviewed and documented in the EPIC chart. ? ?ROS:  A ROS was performed and pertinent positives and negatives are included. ? ?Exam: ? ?There were no vitals filed for this visit. ?There is no height or weight on file to calculate BMI. ? ?General appearance:  Normal ?Thyroid:  Symmetrical, normal in size, without palpable masses or nodularity. ?Respiratory ? Auscultation:  Clear without wheezing or rhonchi ?Cardiovascular ? Auscultation:  Regular rate, without rubs, murmurs or gallops ? Edema/varicosities:  Not grossly evident ?Abdominal ? Soft,nontender, without masses, guarding or rebound. ? Liver/spleen:  No organomegaly noted ? Hernia:  None appreciated ? Skin ? Inspection:  Grossly normal ?Breasts: Examined lying and sitting.  ? Right: Without masses, retractions, nipple discharge or axillary adenopathy. ? ? Left: Without masses, retractions, nipple discharge or axillary adenopathy. ?Genitourinary  ? Inguinal/mons:  Normal without inguinal adenopathy ? External genitalia:  Normal appearing vulva with no masses, tenderness, or lesions ? BUS/Urethra/Skene's glands:  Normal ? Vagina:  Normal appearing with normal color and discharge, no lesions ? Cervix:  Normal appearing without discharge or lesions ? Uterus:  Normal in  size, shape and contour.  Midline and mobile, nontender ? Adnexa/parametria:   ?  Rt: Normal in size, without masses or tenderness. ?  Lt: Normal in size, without masses or tenderness. ? Anus and perineum: Normal ? Digital rectal exam: Normal sphincter tone without palpated masses or tenderness ? ?Patient informed chaperone available to be present for breast and pelvic exam. Patient has requested no chaperone to be present. Patient has been advised what will be completed during breast and pelvic exam.  ? ?Assessment/Plan:  57 y.o. [id number] for annual exam.  ? ? ?Return in 1 year for annual.  ? ?Tamela Gammon DNP, 10:33 AM 10/29/2021 ? ?

## 2021-11-02 NOTE — Progress Notes (Signed)
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     Caitlin Dunlap is a Black 56 year old man with past medical history notable for uncontrolled hypertension, uncontrolled IDDM 1 with diabetic retinopathy/nephropathy, recent episode of hypertensive emergency, demand ischemia, history of medication noncompliance.      His last appointment with me was on 04/17/2022 for a decreased kidney function. Initial work-up was notable for elevated K/L ratio without M-spike. ANCA/C3/C4/PLA2R antibody were unrevealing. A kidney biopsy was offerred but declined by the patient.  A kidney ultrasound reviewed evidence of medical renal disease. Lokelma 5 g and ferrous sulfate were started.  A course of therapeutic vitamin D was started.  Nifedipine dose was lowered.  Currently not on ACE/ARB.    Later he was hospitalized between 12/4-12/7 for SOB iso influenza A requiring BiPAP. Then again between 12/21-12/22 for HTN emergency with a  flash pulmonary edema along with an AKI on CKD.    Review of Systems:   As per HPI. All other systems reviewed are negative.    Home Medication:  Patient's Medications   New Prescriptions    No medications on file   Previous Medications    ASPIRIN 81 MG CHEWABLE TABLET    Take 1 tablet by mouth in the morning.    CARVEDILOL (COREG) 25 MG TABLET    Take 1 tablet by mouth in the morning and 1 tablet in the evening. Take with meals.    CONTINUOUS BLOOD GLUC SENSOR (DEXCOM G6 SENSOR) MISC    Use 1 each As Directed See Admin Instructions Use for 10 days    CONTINUOUS BLOOD GLUC TRANSMIT (DEXCOM G6 TRANSMITTER) MISC    Use 1 each As Directed See Admin Instructions Use for 3 mos    DOXAZOSIN (CARDURA) 1 MG TABLET    Take 1 tablet by mouth at bedtime    ERGOCALCIFEROL (VITAMIN D2) 50000 UNIT CAPSULE    Take 1 capsule by mouth once a week  for 12 doses    FERROUS SULFATE 325 (65 FE) MG EC TABLET    Take 1 tablet by mouth every  other day    HUMALOG 100 UNIT/ML INJECTION    Take up to 100 units daily through insulin pump    INSULIN ASPART (NOVOLOG) 100 UNIT/ML INJECTION    Inject up to 100 units daily via insulin pump    INSULIN DEGLUDEC (TRESIBA FLEXTOUCH) 100 UNIT/ML INJECTION    28 units nightly    INSULIN DISPOSABLE PUMP (OMNIPOD 5 G6 INTRO, GEN 5,) KIT    Use 1 each As Directed every third day    INSULIN DISPOSABLE PUMP (OMNIPOD 5 G6 POD, GEN 5,) MISC    Use 1 each As Directed every third day Change pods every 72 hours    INSULIN PEN NEEDLE (PEN NEEDLES) 32G X 4 MM MISC    Use 1 each As Directed 4 (four) times daily    NIFEDIPINE (ADALAT CC) 90 MG 24 HR TABLET    Take 1 tablet by mouth in the morning.    ROSUVASTATIN (CRESTOR) 10 MG TABLET    Take 1 tablet by mouth in the morning.    SODIUM ZIRCONIUM CYCLOSILICATE (LOKELMA) 5 G PACK    Take 1 packet by mouth in the morning.    TORSEMIDE 40 MG TABS    Take 40 mg by mouth in the morning.   Modified Medications      No medications on file   Discontinued Medications    No medications on file       Physical Exam:   Vital Signs: There were no vitals taken for this visit.    GENERAL: Normal appearance, well-developed  HEENT: Grossly within normal limit  LUNGS: Clear to auscultation  CARDIAC: Regular S1, S2, no gallop or murmur or rub  ABDOMEN: Soft, non tender, BS+, no CVA tenderness, no guarding or rebound tenderness.  EXTREMITIES: No edema  NEUROLOGIC: Grossly intact, no focal neurodeficits    Recent labs: Reviewed  Lab Results   Component Value Date    NA 144 07/02/2022    K 5.0 07/02/2022    CL 112 (H) 07/02/2022    CO2 21 07/02/2022    BUN 63 (H) 07/02/2022    CREAT 4.2 (H) 07/02/2022    GFR 18 (L) 07/02/2022    GLUCOSER 114 07/02/2022    CA 8.4 (L) 07/02/2022    PHOS 4.6 07/02/2022    ALBUMIN 2.3 (L) 07/01/2022    PTHINTACTWC 102 (H) 04/16/2022    VITD25H 19 (L) 07/01/2022       Lab Results   Component Value Date    WBC 6.1 07/02/2022    HGB 9.6 (L) 07/02/2022    HCT 29.5 (L) 07/02/2022     PLTA 308 07/02/2022       Urine Analysis: Reviewed  No results for input(s): "UACOL", "UACLA", "UAGLU", "UABIL", "UAKET", "SPEGRAVURINE", "UAOCC", "UAPH", "UAPRO", "UANIT", "LEUKOCYTES", "ALBCREATR" in the last 72 hours.    Imaging: Reviewed  No imaging results were found within the past 3 days.    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 There is possibly a significant microvascular disease as evidenced by highly fluctuating kidney function, possibly volume related. He also has evidence of diabetic retinopathy/nephropathy. His hemoglobin A1c remains highly elevated.  Currently on insulin.  Initially, I plan to start RAAS blockade/SGLT2 inhibitor which are contraindicated due to hyperkalemia/low GFR, respectively.  However, with his creatinine trending up consistently, lately, a thorough work-up should be pursued. Differential is broad including secondary FSGS/IgAN/paraprotein/ANCA.  - To get a renal ultrasound  - BMP today showed uptrending Cr, 2.9 with mildy elevated K, 5.4  - need a close follow up of his Cr, plan to repeat Cr in 2 weeks  -Urine showed coarse granular casts, no cellular casts, low concern for active GN. Possibly volume related Cr fluctuation, low BP noted today.   -UPCR 1.6g, UACR 0.2g, mainly non-albumin proteinuria, further w/u is warranted  -to check ANA, dsDNA ab, ANCA, SPEP/UPEP/FLC,C3,C4, and PLA2R ab, low threshold to get a kidney biopsy, will discuss with the patient with initial w/u results.     #Hyperkalemia  Possibly due to decreased kidney function/increase intake  - Low potassium diet  - to start Lokelma 5 g daily     #Hypertension, BP is controlled  - Previously on nifedipine 30 mg daily, now 60 mg daily. With elevated Cr and low BP, plan to lower the dose back to 30 mg daily  -the patient was  advised to monitor BP at home closely. He was advised to go back to 60 mg daily if his BP remains above 130/90, consistently.  - Low-salt diet     #Anemia  Iron 67, ferritin 172, transferring sat 28%, Hb 10  -to start   ferrous sulfate 325 mg every other day     #Lower extremity edema  Possible side effect of nifedipine and proteinuria  - To continue to monitor  - The patient was advised to elevate lower extremity when he is able to  - Low-salt diet  -to lower nifedipine to 30 mg     #Mineral Bone disease  #Hyperphosphatemia, 5.1  #Vitamin D deficiency, Vit D <6  #secondary hyperparathyroidism, PTH 102  -Low phosphorus diet  -start vit D 50,000 units weekly for 12 weeks then followed by 2,000 units daily thereafter     #Follow-up: ***    Voravech Nissaisorakarn, MD  Division of Nephrology    [facility] [address]  [address] [place] [address]  [phone]  07/08/2022        Answering Service: [phone]

## 2021-11-10 ENCOUNTER — Ambulatory Visit: Payer: Self-pay | Admitting: Emergency Medicine

## 2022-09-23 ENCOUNTER — Encounter: Payer: Self-pay | Admitting: Gastroenterology

## 2023-05-17 ENCOUNTER — Ambulatory Visit (INDEPENDENT_AMBULATORY_CARE_PROVIDER_SITE_OTHER): Payer: BLUE CROSS/BLUE SHIELD

## 2023-05-17 DIAGNOSIS — Z23 Encounter for immunization: Secondary | ICD-10-CM

## 2023-05-20 ENCOUNTER — Other Ambulatory Visit (HOSPITAL_BASED_OUTPATIENT_CLINIC_OR_DEPARTMENT_OTHER): Payer: Self-pay

## 2023-05-20 MED ORDER — COVID-19 MRNA VAC-TRIS(PFIZER) 30 MCG/0.3ML IM SUSY
0.3000 mL | PREFILLED_SYRINGE | Freq: Once | INTRAMUSCULAR | 0 refills | Status: AC
  Filled 2023-05-20: qty 0.3, 1d supply, fill #0
